# Patient Record
Sex: Male | Born: 1945
Health system: Southern US, Community
[De-identification: ages and names within clinical notes are randomized; demographics above are authoritative.]

## PROBLEM LIST (undated history)

## (undated) DIAGNOSIS — IMO0001 Reserved for inherently not codable concepts without codable children: Secondary | ICD-10-CM

## (undated) DIAGNOSIS — R351 Nocturia: Secondary | ICD-10-CM

## (undated) DIAGNOSIS — I5020 Unspecified systolic (congestive) heart failure: Secondary | ICD-10-CM

## (undated) DIAGNOSIS — E785 Hyperlipidemia, unspecified: Secondary | ICD-10-CM

## (undated) DIAGNOSIS — N410 Acute prostatitis: Secondary | ICD-10-CM

## (undated) DIAGNOSIS — I420 Dilated cardiomyopathy: Secondary | ICD-10-CM

## (undated) DIAGNOSIS — R339 Retention of urine, unspecified: Secondary | ICD-10-CM

## (undated) DIAGNOSIS — I2510 Atherosclerotic heart disease of native coronary artery without angina pectoris: Secondary | ICD-10-CM

## (undated) DIAGNOSIS — I4901 Ventricular fibrillation: Secondary | ICD-10-CM

## (undated) DIAGNOSIS — I10 Essential (primary) hypertension: Secondary | ICD-10-CM

## (undated) DIAGNOSIS — N400 Enlarged prostate without lower urinary tract symptoms: Secondary | ICD-10-CM

## (undated) DIAGNOSIS — N481 Balanitis: Secondary | ICD-10-CM

## (undated) DIAGNOSIS — N471 Phimosis: Secondary | ICD-10-CM

## (undated) HISTORY — DX: Ventricular fibrillation: I49.01

## (undated) HISTORY — DX: Hyperlipidemia, unspecified: E78.5

## (undated) HISTORY — DX: Phimosis: N47.1

## (undated) HISTORY — DX: Retention of urine, unspecified: R33.9

## (undated) HISTORY — DX: Atherosclerotic heart disease of native coronary artery without angina pectoris: I25.10

## (undated) HISTORY — DX: Essential (primary) hypertension: I10

## (undated) HISTORY — PX: OTHER SURGICAL HISTORY: SHX169

## (undated) HISTORY — DX: Nocturia: R35.1

## (undated) HISTORY — DX: Acute prostatitis: N41.0

## (undated) HISTORY — DX: Benign prostatic hyperplasia without lower urinary tract symptoms: N40.0

## (undated) HISTORY — DX: Unspecified systolic (congestive) heart failure: I50.20

## (undated) HISTORY — PX: REPLACEMENT TOTAL KNEE: SUR1224

## (undated) HISTORY — DX: Reserved for inherently not codable concepts without codable children: IMO0001

## (undated) HISTORY — DX: Balanitis: N48.1

## (undated) HISTORY — DX: Dilated cardiomyopathy: I42.0

---

## 2004-06-15 ENCOUNTER — Other Ambulatory Visit: Payer: Self-pay

## 2007-06-15 ENCOUNTER — Ambulatory Visit: Payer: Self-pay | Admitting: General Surgery

## 2009-04-15 ENCOUNTER — Inpatient Hospital Stay: Payer: Self-pay | Admitting: Internal Medicine

## 2009-10-17 ENCOUNTER — Ambulatory Visit: Payer: Self-pay | Admitting: Cardiology

## 2009-10-24 ENCOUNTER — Ambulatory Visit: Payer: Self-pay | Admitting: Cardiology

## 2014-03-14 DIAGNOSIS — I10 Essential (primary) hypertension: Secondary | ICD-10-CM | POA: Insufficient documentation

## 2014-03-14 DIAGNOSIS — I5020 Unspecified systolic (congestive) heart failure: Secondary | ICD-10-CM | POA: Insufficient documentation

## 2014-03-14 DIAGNOSIS — E785 Hyperlipidemia, unspecified: Secondary | ICD-10-CM | POA: Insufficient documentation

## 2014-03-14 DIAGNOSIS — Z95810 Presence of automatic (implantable) cardiac defibrillator: Secondary | ICD-10-CM | POA: Insufficient documentation

## 2014-03-21 ENCOUNTER — Emergency Department: Payer: Self-pay | Admitting: Emergency Medicine

## 2014-03-21 LAB — CBC
HCT: 40.9 % (ref 40.0–52.0)
HGB: 13.8 g/dL (ref 13.0–18.0)
MCH: 31.1 pg (ref 26.0–34.0)
MCHC: 33.9 g/dL (ref 32.0–36.0)
MCV: 92 fL (ref 80–100)
Platelet: 268 10*3/uL (ref 150–440)
RBC: 4.45 10*6/uL (ref 4.40–5.90)
RDW: 12.8 % (ref 11.5–14.5)
WBC: 6.6 10*3/uL (ref 3.8–10.6)

## 2014-03-21 LAB — COMPREHENSIVE METABOLIC PANEL
AST: 27 U/L (ref 15–37)
Albumin: 3.9 g/dL (ref 3.4–5.0)
Alkaline Phosphatase: 107 U/L
Anion Gap: 6 — ABNORMAL LOW (ref 7–16)
BUN: 22 mg/dL — AB (ref 7–18)
Bilirubin,Total: 0.6 mg/dL (ref 0.2–1.0)
CALCIUM: 8.4 mg/dL — AB (ref 8.5–10.1)
CHLORIDE: 108 mmol/L — AB (ref 98–107)
CO2: 26 mmol/L (ref 21–32)
Creatinine: 1.17 mg/dL (ref 0.60–1.30)
EGFR (African American): >60
EGFR (Non-African Amer.): >60
GLUCOSE: 137 mg/dL — AB (ref 65–99)
Osmolality: 285 (ref 275–301)
Potassium: 3.8 mmol/L (ref 3.5–5.1)
SGPT (ALT): 32 U/L (ref 12–78)
Sodium: 140 mmol/L (ref 136–145)
TOTAL PROTEIN: 7 g/dL (ref 6.4–8.2)

## 2014-03-21 LAB — PROTIME-INR
INR: 0.9
Prothrombin Time: 12.4 secs (ref 11.5–14.7)

## 2014-03-21 LAB — APTT: Activated PTT: 37.8 secs — ABNORMAL HIGH (ref 23.6–35.9)

## 2014-03-21 LAB — CK TOTAL AND CKMB (NOT AT ARMC)
CK, Total: 105 U/L
CK-MB: 1.5 ng/mL (ref 0.5–3.6)

## 2014-03-21 LAB — TROPONIN I: Troponin-I: <0.02 ng/mL

## 2014-04-23 ENCOUNTER — Inpatient Hospital Stay: Payer: Self-pay | Admitting: Internal Medicine

## 2014-04-23 LAB — CBC WITH DIFFERENTIAL/PLATELET
Basophil #: 0.1 10*3/uL (ref 0.0–0.1)
Basophil %: 0.9 %
Eosinophil #: 0.1 10*3/uL (ref 0.0–0.7)
Eosinophil %: 2 %
HCT: 40.4 % (ref 40.0–52.0)
HGB: 13.9 g/dL (ref 13.0–18.0)
Lymphocyte #: 1.3 10*3/uL (ref 1.0–3.6)
Lymphocyte %: 19.6 %
MCH: 31.8 pg (ref 26.0–34.0)
MCHC: 34.5 g/dL (ref 32.0–36.0)
MCV: 92 fL (ref 80–100)
Monocyte #: 0.6 x10 3/mm (ref 0.2–1.0)
Monocyte %: 8.9 %
NEUTROS PCT: 68.6 %
Neutrophil #: 4.7 10*3/uL (ref 1.4–6.5)
PLATELETS: 264 10*3/uL (ref 150–440)
RBC: 4.38 10*6/uL — ABNORMAL LOW (ref 4.40–5.90)
RDW: 13 % (ref 11.5–14.5)
WBC: 6.8 10*3/uL (ref 3.8–10.6)

## 2014-04-23 LAB — BASIC METABOLIC PANEL
Anion Gap: 6 — ABNORMAL LOW (ref 7–16)
BUN: 21 mg/dL — ABNORMAL HIGH (ref 7–18)
CALCIUM: 8.7 mg/dL (ref 8.5–10.1)
CHLORIDE: 108 mmol/L — AB (ref 98–107)
CO2: 24 mmol/L (ref 21–32)
Creatinine: 0.98 mg/dL (ref 0.60–1.30)
EGFR (African American): >60
EGFR (Non-African Amer.): >60
Glucose: 133 mg/dL — ABNORMAL HIGH (ref 65–99)
Osmolality: 281 (ref 275–301)
Potassium: 3.5 mmol/L (ref 3.5–5.1)
SODIUM: 138 mmol/L (ref 136–145)

## 2014-04-23 LAB — TROPONIN I
Troponin-I: <0.02 ng/mL
Troponin-I: <0.02 ng/mL

## 2014-04-24 LAB — LIPID PANEL
Cholesterol: 105 mg/dL (ref 0–200)
HDL Cholesterol: 36 mg/dL — ABNORMAL LOW (ref 40–60)
Ldl Cholesterol, Calc: 57 mg/dL (ref 0–100)
Triglycerides: 58 mg/dL (ref 0–200)
VLDL Cholesterol, Calc: 12 mg/dL (ref 5–40)

## 2014-04-24 LAB — MAGNESIUM: Magnesium: 2.6 mg/dL — ABNORMAL HIGH

## 2014-04-24 LAB — HEMOGLOBIN A1C: HEMOGLOBIN A1C: 6.3 % (ref 4.2–6.3)

## 2014-04-24 LAB — BASIC METABOLIC PANEL
Anion Gap: 5 — ABNORMAL LOW (ref 7–16)
BUN: 19 mg/dL — AB (ref 7–18)
CHLORIDE: 110 mmol/L — AB (ref 98–107)
CREATININE: 0.95 mg/dL (ref 0.60–1.30)
Calcium, Total: 8.8 mg/dL (ref 8.5–10.1)
Co2: 28 mmol/L (ref 21–32)
EGFR (African American): >60
Glucose: 88 mg/dL (ref 65–99)
Osmolality: 287 (ref 275–301)
Potassium: 4.4 mmol/L (ref 3.5–5.1)
SODIUM: 143 mmol/L (ref 136–145)

## 2014-04-24 LAB — CBC WITH DIFFERENTIAL/PLATELET
BASOS ABS: 0 10*3/uL (ref 0.0–0.1)
Basophil %: 0.6 %
Eosinophil #: 0.1 10*3/uL (ref 0.0–0.7)
Eosinophil %: 1.6 %
HCT: 41.4 % (ref 40.0–52.0)
HGB: 14 g/dL (ref 13.0–18.0)
Lymphocyte #: 1.3 10*3/uL (ref 1.0–3.6)
Lymphocyte %: 19.4 %
MCH: 31.2 pg (ref 26.0–34.0)
MCHC: 33.8 g/dL (ref 32.0–36.0)
MCV: 92 fL (ref 80–100)
Monocyte #: 0.6 x10 3/mm (ref 0.2–1.0)
Monocyte %: 8.9 %
Neutrophil #: 4.6 10*3/uL (ref 1.4–6.5)
Neutrophil %: 69.5 %
Platelet: 246 10*3/uL (ref 150–440)
RBC: 4.48 10*6/uL (ref 4.40–5.90)
RDW: 12.9 % (ref 11.5–14.5)
WBC: 6.6 10*3/uL (ref 3.8–10.6)

## 2014-04-24 LAB — TROPONIN I

## 2014-04-25 LAB — CBC
HCT: 43.4 % (ref 40.0–52.0)
HGB: 14.6 g/dL (ref 13.0–18.0)
MCH: 31.4 pg (ref 26.0–34.0)
MCHC: 33.6 g/dL (ref 32.0–36.0)
MCV: 94 fL (ref 80–100)
Platelet: 269 10*3/uL (ref 150–440)
RBC: 4.64 10*6/uL (ref 4.40–5.90)
RDW: 12.8 % (ref 11.5–14.5)
WBC: 8.1 10*3/uL (ref 3.8–10.6)

## 2014-04-25 LAB — BASIC METABOLIC PANEL
ANION GAP: 4 — AB (ref 7–16)
BUN: 14 mg/dL (ref 7–18)
CO2: 27 mmol/L (ref 21–32)
Calcium, Total: 8.8 mg/dL (ref 8.5–10.1)
Chloride: 111 mmol/L — ABNORMAL HIGH (ref 98–107)
Creatinine: 0.88 mg/dL (ref 0.60–1.30)
EGFR (African American): >60
EGFR (Non-African Amer.): >60
Glucose: 114 mg/dL — ABNORMAL HIGH (ref 65–99)
OSMOLALITY: 284 (ref 275–301)
Potassium: 4.8 mmol/L (ref 3.5–5.1)
Sodium: 142 mmol/L (ref 136–145)

## 2014-04-26 LAB — BASIC METABOLIC PANEL
ANION GAP: 3 — AB (ref 7–16)
BUN: 11 mg/dL (ref 7–18)
CHLORIDE: 109 mmol/L — AB (ref 98–107)
CREATININE: 0.98 mg/dL (ref 0.60–1.30)
Calcium, Total: 8.8 mg/dL (ref 8.5–10.1)
Co2: 27 mmol/L (ref 21–32)
EGFR (African American): >60
EGFR (Non-African Amer.): >60
GLUCOSE: 94 mg/dL (ref 65–99)
OSMOLALITY: 277 (ref 275–301)
POTASSIUM: 4.2 mmol/L (ref 3.5–5.1)
SODIUM: 139 mmol/L (ref 136–145)

## 2014-04-26 LAB — CK TOTAL AND CKMB (NOT AT ARMC)
CK, Total: 47 U/L
CK-MB: 0.8 ng/mL (ref 0.5–3.6)

## 2014-04-29 DIAGNOSIS — I2510 Atherosclerotic heart disease of native coronary artery without angina pectoris: Secondary | ICD-10-CM | POA: Insufficient documentation

## 2014-04-29 DIAGNOSIS — I4901 Ventricular fibrillation: Secondary | ICD-10-CM | POA: Insufficient documentation

## 2014-04-29 DIAGNOSIS — I420 Dilated cardiomyopathy: Secondary | ICD-10-CM | POA: Insufficient documentation

## 2014-10-02 DIAGNOSIS — N138 Other obstructive and reflux uropathy: Secondary | ICD-10-CM | POA: Insufficient documentation

## 2014-10-02 DIAGNOSIS — N401 Enlarged prostate with lower urinary tract symptoms: Secondary | ICD-10-CM

## 2014-12-23 DIAGNOSIS — R339 Retention of urine, unspecified: Secondary | ICD-10-CM | POA: Diagnosis not present

## 2014-12-23 DIAGNOSIS — N471 Phimosis: Secondary | ICD-10-CM | POA: Diagnosis not present

## 2014-12-23 DIAGNOSIS — N400 Enlarged prostate without lower urinary tract symptoms: Secondary | ICD-10-CM | POA: Diagnosis not present

## 2014-12-23 DIAGNOSIS — R351 Nocturia: Secondary | ICD-10-CM | POA: Diagnosis not present

## 2014-12-23 DIAGNOSIS — R350 Frequency of micturition: Secondary | ICD-10-CM | POA: Diagnosis not present

## 2014-12-31 DIAGNOSIS — I471 Supraventricular tachycardia: Secondary | ICD-10-CM | POA: Diagnosis not present

## 2015-03-05 DIAGNOSIS — J0100 Acute maxillary sinusitis, unspecified: Secondary | ICD-10-CM | POA: Diagnosis not present

## 2015-03-13 ENCOUNTER — Emergency Department: Payer: Self-pay | Admitting: Emergency Medicine

## 2015-03-13 DIAGNOSIS — Z87891 Personal history of nicotine dependence: Secondary | ICD-10-CM | POA: Diagnosis not present

## 2015-03-13 DIAGNOSIS — R531 Weakness: Secondary | ICD-10-CM | POA: Diagnosis not present

## 2015-03-13 DIAGNOSIS — R61 Generalized hyperhidrosis: Secondary | ICD-10-CM | POA: Diagnosis not present

## 2015-03-13 DIAGNOSIS — R42 Dizziness and giddiness: Secondary | ICD-10-CM | POA: Diagnosis not present

## 2015-03-13 DIAGNOSIS — I10 Essential (primary) hypertension: Secondary | ICD-10-CM | POA: Diagnosis not present

## 2015-03-13 DIAGNOSIS — R55 Syncope and collapse: Secondary | ICD-10-CM | POA: Diagnosis not present

## 2015-03-13 LAB — BASIC METABOLIC PANEL
Anion Gap: 7 (ref 7–16)
BUN: 22 mg/dL — AB
Calcium, Total: 8.6 mg/dL — ABNORMAL LOW
Chloride: 102 mmol/L
Co2: 27 mmol/L
Creatinine: 1.16 mg/dL
EGFR (African American): >60
EGFR (Non-African Amer.): >60
Glucose: 105 mg/dL — ABNORMAL HIGH
POTASSIUM: 3.7 mmol/L
Sodium: 136 mmol/L

## 2015-03-13 LAB — CBC
HCT: 37 % — ABNORMAL LOW (ref 40.0–52.0)
HGB: 12.6 g/dL — ABNORMAL LOW (ref 13.0–18.0)
MCH: 31.7 pg (ref 26.0–34.0)
MCHC: 34 g/dL (ref 32.0–36.0)
MCV: 93 fL (ref 80–100)
Platelet: 340 10*3/uL (ref 150–440)
RBC: 3.97 10*6/uL — ABNORMAL LOW (ref 4.40–5.90)
RDW: 13.2 % (ref 11.5–14.5)
WBC: 10.2 10*3/uL (ref 3.8–10.6)

## 2015-03-13 LAB — TROPONIN I
Troponin-I: <0.03 ng/mL
Troponin-I: <0.03 ng/mL

## 2015-03-18 DIAGNOSIS — E782 Mixed hyperlipidemia: Secondary | ICD-10-CM | POA: Diagnosis not present

## 2015-03-18 DIAGNOSIS — I5022 Chronic systolic (congestive) heart failure: Secondary | ICD-10-CM | POA: Diagnosis not present

## 2015-03-18 DIAGNOSIS — I2510 Atherosclerotic heart disease of native coronary artery without angina pectoris: Secondary | ICD-10-CM | POA: Diagnosis not present

## 2015-03-18 DIAGNOSIS — I4901 Ventricular fibrillation: Secondary | ICD-10-CM | POA: Diagnosis not present

## 2015-03-25 DIAGNOSIS — I10 Essential (primary) hypertension: Secondary | ICD-10-CM | POA: Diagnosis not present

## 2015-03-25 DIAGNOSIS — I4901 Ventricular fibrillation: Secondary | ICD-10-CM | POA: Diagnosis not present

## 2015-03-25 DIAGNOSIS — I429 Cardiomyopathy, unspecified: Secondary | ICD-10-CM | POA: Diagnosis not present

## 2015-03-25 DIAGNOSIS — E780 Pure hypercholesterolemia: Secondary | ICD-10-CM | POA: Diagnosis not present

## 2015-03-27 ENCOUNTER — Ambulatory Visit
Admit: 2015-03-27 | Disposition: A | Discharge status: Home / Self Care | Payer: Self-pay | Attending: Cardiology | Admitting: Cardiology

## 2015-03-27 DIAGNOSIS — I472 Ventricular tachycardia: Secondary | ICD-10-CM | POA: Diagnosis not present

## 2015-03-27 DIAGNOSIS — I420 Dilated cardiomyopathy: Secondary | ICD-10-CM | POA: Diagnosis not present

## 2015-03-27 DIAGNOSIS — I2510 Atherosclerotic heart disease of native coronary artery without angina pectoris: Secondary | ICD-10-CM | POA: Diagnosis not present

## 2015-03-27 DIAGNOSIS — I4901 Ventricular fibrillation: Secondary | ICD-10-CM | POA: Diagnosis not present

## 2015-03-27 DIAGNOSIS — I10 Essential (primary) hypertension: Secondary | ICD-10-CM | POA: Diagnosis not present

## 2015-03-27 DIAGNOSIS — N400 Enlarged prostate without lower urinary tract symptoms: Secondary | ICD-10-CM | POA: Diagnosis not present

## 2015-03-27 DIAGNOSIS — E780 Pure hypercholesterolemia: Secondary | ICD-10-CM | POA: Diagnosis not present

## 2015-03-27 DIAGNOSIS — I5023 Acute on chronic systolic (congestive) heart failure: Secondary | ICD-10-CM | POA: Diagnosis not present

## 2015-03-27 DIAGNOSIS — E785 Hyperlipidemia, unspecified: Secondary | ICD-10-CM | POA: Diagnosis not present

## 2015-03-27 DIAGNOSIS — I5022 Chronic systolic (congestive) heart failure: Secondary | ICD-10-CM | POA: Diagnosis not present

## 2015-03-27 DIAGNOSIS — I447 Left bundle-branch block, unspecified: Secondary | ICD-10-CM | POA: Diagnosis not present

## 2015-04-01 DIAGNOSIS — F418 Other specified anxiety disorders: Secondary | ICD-10-CM | POA: Diagnosis not present

## 2015-04-01 DIAGNOSIS — I4901 Ventricular fibrillation: Secondary | ICD-10-CM | POA: Diagnosis not present

## 2015-04-01 DIAGNOSIS — E780 Pure hypercholesterolemia: Secondary | ICD-10-CM | POA: Diagnosis not present

## 2015-04-01 DIAGNOSIS — I429 Cardiomyopathy, unspecified: Secondary | ICD-10-CM | POA: Diagnosis not present

## 2015-04-01 DIAGNOSIS — I471 Supraventricular tachycardia: Secondary | ICD-10-CM | POA: Diagnosis not present

## 2015-04-08 DIAGNOSIS — I429 Cardiomyopathy, unspecified: Secondary | ICD-10-CM | POA: Diagnosis not present

## 2015-04-08 DIAGNOSIS — I10 Essential (primary) hypertension: Secondary | ICD-10-CM | POA: Diagnosis not present

## 2015-04-08 DIAGNOSIS — E780 Pure hypercholesterolemia: Secondary | ICD-10-CM | POA: Diagnosis not present

## 2015-04-08 DIAGNOSIS — N401 Enlarged prostate with lower urinary tract symptoms: Secondary | ICD-10-CM | POA: Diagnosis not present

## 2015-04-09 DIAGNOSIS — E780 Pure hypercholesterolemia: Secondary | ICD-10-CM | POA: Diagnosis not present

## 2015-04-12 NOTE — H&P (Signed)
PATIENT NAME:  [name], [name]#:  [id number] DATE OF BIRTH:  Dec 09, 1946  DATE OF ADMISSION:  04/23/2014  PRIMARY CARE PHYSICIAN:  Dr. Maryjane HurterFeldpausch  CHIEF COMPLAINT: Syncope.   HISTORY OF PRESENT ILLNESS: This is a 69 year old man with history of a defibrillator and congestive heart failure. Since Easter weekend, he had a pass-out  and then they changed medications and adjusted his defibrillator. He has had more fainting spells since then. Last few days, more frequent.  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 Today, he had a fainting spell where he felt it coming and ended up on the floor and hit his head on the concrete and he has a little headache. He does feel weaker than usual. Four weeks ago, he also had another episode where he lost consciousness. In the ER, the defibrillator was interrogated and he had ventricular tachycardia. Hospitalist services were contacted for further evaluation. ER physician, Dr. Ethelda ChickJacubowitz contacted Dr. Lady GaryFath for a consultation from the Emergency Room.   PAST MEDICAL HISTORY: Congestive heart failure, hypertension, hyperlipidemia.   PAST SURGICAL HISTORY: Defibrillator, right knee replacement.   ALLERGIES: No known drug allergies.   MEDICATIONS: Include aspirin 81 mg daily, Coreg 6.25 mg twice a day, Crestor 10 mg once a day at bedtime, enalapril 2.5 mg daily, furosemide 40 mg daily, potassium chloride 20 mEq daily.  He thinks his sotalol is 80 mg twice a day.   SOCIAL HISTORY: No smoking. No alcohol. No drug use. 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   FAMILY HISTORY: Father with COPD and coronary artery disease. Mother died of a CVA. Had CABG at age 67 and also stents after that.   REVIEW OF SYSTEMS:  CONSTITUTIONAL: Positive for headache. Positive for weakness. No fever, chills, or sweats.  EYES: Blurry vision. Wears glasses.  EARS, NOSE, MOUTH, AND THROAT: Ringing in the ears. No sore throat. No difficulty swallowing.  CARDIOVASCULAR: No chest pain. No  palpitations.  RESPIRATORY: Positive for shortness of breath. No cough. No sputum. No hemoptysis.  GASTROINTESTINAL: No nausea. No vomiting. No abdominal pain. No diarrhea. No constipation. No bright red blood per rectum. No melena.  GENITOURINARY: No burning on urination. No hematuria.  MUSCULOSKELETAL: Positive for shoulder pain.  INTEGUMENT: No rashes or eruptions.  NEUROLOGIC: Positive for passing out.  INTEGUMENT: No rashes or eruptions.  PSYCHIATRIC: No anxiety or depression.  ENDOCRINE: No thyroid problems.  HEMATOLOGIC AND LYMPHATIC: No anemia, no easy bruising or bleeding.   PHYSICAL EXAMINATION:  VITAL SIGNS: Temperature 98, pulse 74, respirations 20, blood pressure 154/74, pulse oximetry 95% on room air.  GENERAL: No respiratory distress.  EYES: Conjunctivae and lids normal. Pupils equal, round, and reactive to light. Extraocular muscles intact. No nystagmus.  EARS, NOSE, MOUTH, AND THROAT: Tympanic membranes: No erythema. Nasal mucosa: No erythema. Throat: No erythema. No exudate seen. Lips and gums: No lesions.  NECK: No JVD. No bruits. No lymphadenopathy. No thyromegaly. No thyroid nodules palpated.  RESPIRATORY:  Lungs clear to auscultation. No use of accessory muscles to breathe. No rhonchi, rales, or wheeze heard.  CARDIOVASCULAR: S1, S2 normal. No gallops, rubs, or murmurs heard. Carotid upstroke 2+ bilaterally. No bruits. Dorsalis pedis pulses 2+ bilaterally. No edema of the lower extremity.  ABDOMEN: Soft, nontender. No organomegaly, splenomegaly. Normoactive bowel sounds. No masses felt.  LYMPHATIC: No lymph nodes in the neck.  MUSCULOSKELETAL: No clubbing, edema, or cyanosis.  SKIN: No ulcers or lesions seen.  NEUROLOGIC: Cranial nerves II-XII grossly intact. Deep  tendon reflexes 2+ bilateral lower extremities.  PSYCHIATRIC: The patient is oriented to person, place, and time.  LABORATORY AND RADIOLOGICAL DATA: Glucose 133, BUN 21, creatinine 0.98, sodium 138, potassium  3.5, chloride 108, CO2 of 24, calcium 8.7. White blood cell count 6.8, H and H 13.9 and 40.4, platelet count of 264,000.  EKG: Paced.   ASSESSMENT AND PLAN:  1. Ventricular tachycardia with syncope. We will admit to the CCU.  ER physician spoke with Dr. Lady Gary who will see the patient. I will increase the patient's sotalol to 120 mg twice a day until Dr. Lady Gary sees the patient. Potential change in medications needed. We will get serial cardiac enzymes and continue to monitor closely.  2. Hypertension. Blood pressure currently stable. Continue enalapril and Coreg.  3. Hyperlipidemia. On Crestor. 4. Impaired fasting glucose. We will check a hemoglobin A1c in the a.m.  5. Hypokalemia. We will decrease the dose of Lasix to 20 mg daily since the patient's BUN is a little bit higher. We will give K-Dur 40 mEq stat and 10 mEq daily.   TIME SPENT ON ADMISSION: 50 minutes.   CODE STATUS:  The patient is a full code.    ____________________________ Herschell Dimes. Renae Gloss, MD rjw:dd D: 04/23/2014 19:15:25 ET T: 04/23/2014 19:46:34 ET JOB#: [id number]  cc: Herschell Dimes. Renae Gloss, MD, <Dictator> Marina Goodell, MD Darlin Priestly Lady Gary, MD  Salley Scarlet MD ELECTRONICALLY SIGNED 04/28/2014 14:48

## 2015-04-12 NOTE — Discharge Summary (Signed)
PATIENT NAME:  [name], [name] MR#:  [id number] DATE OF BIRTH:  1946-02-03  DATE OF ADMISSION:  04/23/2014 DATE OF DISCHARGE:  04/26/2014  PRIMARY CARE PHYSICIAN: Dr. York Cerise.  CARDIOLOGIST:  Dr. Lady Gary.   DISCHARGE DIAGNOSES:  1. Syncope secondary to supraventricular tachycardia.  2. History of coronary artery disease with stent in the ramus branch.   Patient had a cardiac catheterization this time and the stent placed in ramus branch. 3. Nonischemic cardiomyopathy.   MEDICATIONS: Aspirin 81 mg p.o. daily, furosemide 40 mg p.o. daily, Enalapril 2.5 mg p.o. b.i.d., Crestor 10 mg p.o. daily, KCl 20 mEq p.o. daily, Plavix 75 mg p.o. daily, Coreg 12.5 mg p.o. b.i.d. The patient will follow up with Dr. Lady Gary on Monday. At that time, patient will possibly start on amiodarone. The patient was given appointment with Dr. Lady Gary on May 11th at 10 a.m.  Patient's Plavix is also new medication. The patient's atenolol has been stopped.   HOSPITAL COURSE: A 69 year old male patient with a history of defibrillator and CHF has been feeling dizzy recently and had a syncopal episode while he was at work on May 5th. Syncope thought to be secondary to V-tach and patient admitted to ICU and his sotalol was increased to 120 mg b.i.d.  He was taking 80 mg b.i.d. at home. The patient was monitored in the ICU for more than 24 hours. Did not have any further episodes of V-tach and patient continued on sotalol and a small dose of Coreg. Dr. Lady Gary did the cardiac catheterization for him.  Patient's cardiac catheterization was done on May 7th.  It revealed 80% lesion in the ramus intermedius.  The patient had a drug-eluting stent and the patient did not have any other blockages but required intervention and he is moved to ICU and monitored for Antivert 24 hours after the stent placement. The patient started on Plavix regarding his V-tach with a defibrillator in place.  The patient discussed this with EP physician. Probably the  patient will be started on amiodarone as an outpatient. For now he will be on Coreg and Plavix but we stopped the sotalol for him.  The patient went home in stable condition. His kidney function and the white count were within normal limits here in the hospital stay. The patient admission was also normal. Troponins have been negative x 3. The patient's echocardiogram showed EF 20% -25% with severely decreased LV ejection fraction.   PHYSICAL EXAMINATION: DISCHARGE VITAL SIGNS: Temperature is 98.7, heart rate 74, blood pressure 136/70, saturations 98% on room air.  GENERAL: He was alert, awake, oriented. Normocephalic, atraumatic.  EYES: Pupils equal, reacting to light.  CARDIOVASCULAR: s1,s2 regular,no   murmurs. PMI not displaced,   LUNGS: Clear to auscultation. No wheeze, no rales.   The patient works in a nursery.  Has told Dr. Lady Gary about that. He suggested that normal activity in terms of her exertion.  No driving. The patient is to seek followup with him on Monday and at that time he is going to decide.  Patient will remain off sotalol for 3 days.  They are going to start amiodarone as an outpatient at 400 mg twice daily.  TIME SPENT ON DISCHARGE PREPARATION: More than 30 minutes.   Of note, Coreg also increased to 12.5 mg b.i.d.  The patient was taking 6.25 mg b.i.d.    ____________________________ Katha Hamming, MD sk:dd D: 04/28/2014 08:24:17 ET T: 04/28/2014 19:55:20 ET JOB#: [id number]  cc: Katha Hamming, MD, <Dictator> Katha Hamming MD  ELECTRONICALLY SIGNED 05/14/2014 22:21

## 2015-04-12 NOTE — Consult Note (Signed)
Brief Consult Note: Diagnosis: syncope secondary to recurrent vf/vf/torsades.   Patient was seen by consultant.   Recommend further assessment or treatment.   Comments: 69 yo male with history of cardiomyopathy with aicd in place. Had syncope secondary to vf/torsades. Is on sotolol at 120 bid with recurrent vt. Will discuss with ep rergarding further medical therapy. WIll need to assess for evidence of ischemic etiology of his symtpoms. WIll proceed with cardiac cath in am.  Electronic Signatures: Dalia HeadingFath, Mckynzi Cammon A (MD)  (Signed 06-May-15 20:37)  Authored: Brief Consult Note   Last Updated: 06-May-15 20:37 by Dalia HeadingFath, Danelia Snodgrass A (MD)

## 2015-04-15 DIAGNOSIS — I509 Heart failure, unspecified: Secondary | ICD-10-CM | POA: Diagnosis not present

## 2015-04-15 DIAGNOSIS — Z4502 Encounter for adjustment and management of automatic implantable cardiac defibrillator: Secondary | ICD-10-CM | POA: Diagnosis not present

## 2015-04-17 DIAGNOSIS — I429 Cardiomyopathy, unspecified: Secondary | ICD-10-CM | POA: Diagnosis not present

## 2015-04-28 DIAGNOSIS — I509 Heart failure, unspecified: Secondary | ICD-10-CM | POA: Diagnosis not present

## 2015-04-28 DIAGNOSIS — I4901 Ventricular fibrillation: Secondary | ICD-10-CM | POA: Diagnosis not present

## 2015-04-28 DIAGNOSIS — Z4502 Encounter for adjustment and management of automatic implantable cardiac defibrillator: Secondary | ICD-10-CM | POA: Diagnosis not present

## 2015-04-28 DIAGNOSIS — Z87891 Personal history of nicotine dependence: Secondary | ICD-10-CM | POA: Diagnosis not present

## 2015-04-28 DIAGNOSIS — I429 Cardiomyopathy, unspecified: Secondary | ICD-10-CM | POA: Diagnosis not present

## 2015-04-28 DIAGNOSIS — I10 Essential (primary) hypertension: Secondary | ICD-10-CM | POA: Diagnosis not present

## 2015-04-28 DIAGNOSIS — E785 Hyperlipidemia, unspecified: Secondary | ICD-10-CM | POA: Diagnosis not present

## 2015-04-28 DIAGNOSIS — I2510 Atherosclerotic heart disease of native coronary artery without angina pectoris: Secondary | ICD-10-CM | POA: Diagnosis not present

## 2015-04-28 DIAGNOSIS — Z7982 Long term (current) use of aspirin: Secondary | ICD-10-CM | POA: Diagnosis not present

## 2015-04-28 DIAGNOSIS — I472 Ventricular tachycardia: Secondary | ICD-10-CM | POA: Diagnosis not present

## 2015-04-28 DIAGNOSIS — I447 Left bundle-branch block, unspecified: Secondary | ICD-10-CM | POA: Diagnosis not present

## 2015-04-29 DIAGNOSIS — I10 Essential (primary) hypertension: Secondary | ICD-10-CM | POA: Diagnosis not present

## 2015-04-29 DIAGNOSIS — Z87891 Personal history of nicotine dependence: Secondary | ICD-10-CM | POA: Diagnosis not present

## 2015-04-29 DIAGNOSIS — I4901 Ventricular fibrillation: Secondary | ICD-10-CM | POA: Diagnosis not present

## 2015-04-29 DIAGNOSIS — Z4502 Encounter for adjustment and management of automatic implantable cardiac defibrillator: Secondary | ICD-10-CM | POA: Diagnosis not present

## 2015-04-29 DIAGNOSIS — Z95810 Presence of automatic (implantable) cardiac defibrillator: Secondary | ICD-10-CM | POA: Diagnosis not present

## 2015-04-29 DIAGNOSIS — I2510 Atherosclerotic heart disease of native coronary artery without angina pectoris: Secondary | ICD-10-CM | POA: Diagnosis not present

## 2015-04-29 DIAGNOSIS — I429 Cardiomyopathy, unspecified: Secondary | ICD-10-CM | POA: Diagnosis not present

## 2015-04-29 DIAGNOSIS — J939 Pneumothorax, unspecified: Secondary | ICD-10-CM | POA: Diagnosis not present

## 2015-04-29 DIAGNOSIS — E785 Hyperlipidemia, unspecified: Secondary | ICD-10-CM | POA: Diagnosis not present

## 2015-04-29 DIAGNOSIS — Z7982 Long term (current) use of aspirin: Secondary | ICD-10-CM | POA: Diagnosis not present

## 2015-04-29 DIAGNOSIS — I472 Ventricular tachycardia: Secondary | ICD-10-CM | POA: Diagnosis not present

## 2015-04-29 DIAGNOSIS — I509 Heart failure, unspecified: Secondary | ICD-10-CM | POA: Diagnosis not present

## 2015-05-08 DIAGNOSIS — M7581 Other shoulder lesions, right shoulder: Secondary | ICD-10-CM | POA: Diagnosis not present

## 2015-05-13 DIAGNOSIS — I429 Cardiomyopathy, unspecified: Secondary | ICD-10-CM | POA: Diagnosis not present

## 2015-05-22 DIAGNOSIS — M25519 Pain in unspecified shoulder: Secondary | ICD-10-CM | POA: Insufficient documentation

## 2015-05-22 DIAGNOSIS — M25511 Pain in right shoulder: Secondary | ICD-10-CM | POA: Diagnosis not present

## 2015-06-13 ENCOUNTER — Encounter: Payer: Self-pay | Admitting: *Deleted

## 2015-06-19 DIAGNOSIS — M25511 Pain in right shoulder: Secondary | ICD-10-CM | POA: Diagnosis not present

## 2015-06-24 ENCOUNTER — Other Ambulatory Visit: Payer: Self-pay | Admitting: Unknown Physician Specialty

## 2015-06-24 ENCOUNTER — Ambulatory Visit: Payer: Self-pay | Admitting: Urology

## 2015-06-24 DIAGNOSIS — M25511 Pain in right shoulder: Secondary | ICD-10-CM

## 2015-07-01 ENCOUNTER — Ambulatory Visit
Admission: RE | Admit: 2015-07-01 | Discharge: 2015-07-01 | Disposition: A | Discharge status: Home / Self Care | Payer: Commercial Managed Care - HMO | Source: Ambulatory Visit | Attending: Unknown Physician Specialty | Admitting: Unknown Physician Specialty

## 2015-07-01 DIAGNOSIS — M25511 Pain in right shoulder: Secondary | ICD-10-CM | POA: Diagnosis not present

## 2015-07-01 DIAGNOSIS — X58XXXA Exposure to other specified factors, initial encounter: Secondary | ICD-10-CM | POA: Insufficient documentation

## 2015-07-01 DIAGNOSIS — S4381XA Sprain of other specified parts of right shoulder girdle, initial encounter: Secondary | ICD-10-CM | POA: Diagnosis not present

## 2015-07-01 DIAGNOSIS — S46911A Strain of unspecified muscle, fascia and tendon at shoulder and upper arm level, right arm, initial encounter: Secondary | ICD-10-CM | POA: Insufficient documentation

## 2015-07-01 DIAGNOSIS — M19011 Primary osteoarthritis, right shoulder: Secondary | ICD-10-CM | POA: Diagnosis not present

## 2015-07-01 DIAGNOSIS — I471 Supraventricular tachycardia: Secondary | ICD-10-CM | POA: Diagnosis not present

## 2015-07-01 DIAGNOSIS — M75101 Unspecified rotator cuff tear or rupture of right shoulder, not specified as traumatic: Secondary | ICD-10-CM | POA: Diagnosis not present

## 2015-07-01 MED ORDER — IOHEXOL 180 MG/ML  SOLN: 20.0000 mL | Freq: Once | INTRAMUSCULAR | Status: AC | PRN

## 2015-07-01 NOTE — Procedures (Signed)
The procedure and associated risks (including but not limited to infection, contrast reaction, bleeding or nondiagnostic study) were discussed with the patient. Questions were answered. Patient has been off blood thinner for over 5 days. Written as well as oral witness consent was obtained.  Patient was prepped and draped in a sterile fashion. Under fluoroscopic guidance and aseptic technique, a 22 gauge spinal needle was advanced into the right shoulder joint. 14 cc of mixture of (15 cc Omnipaque 180 and 5 cc saline) was instilled. Patient was transported to CT suite. Postprocedure instructions were reviewed with the patient. No immediate complication.

## 2015-07-09 ENCOUNTER — Encounter: Payer: Self-pay | Admitting: Urology

## 2015-07-09 ENCOUNTER — Ambulatory Visit (INDEPENDENT_AMBULATORY_CARE_PROVIDER_SITE_OTHER): Payer: Commercial Managed Care - HMO | Admitting: Urology

## 2015-07-09 VITALS — BP 125/72 | HR 65 | Resp 18 | Ht 66.0 in | Wt 166.8 lb

## 2015-07-09 DIAGNOSIS — N401 Enlarged prostate with lower urinary tract symptoms: Secondary | ICD-10-CM | POA: Diagnosis not present

## 2015-07-09 DIAGNOSIS — R351 Nocturia: Secondary | ICD-10-CM

## 2015-07-09 DIAGNOSIS — N138 Other obstructive and reflux uropathy: Secondary | ICD-10-CM | POA: Insufficient documentation

## 2015-07-09 DIAGNOSIS — N471 Phimosis: Secondary | ICD-10-CM | POA: Insufficient documentation

## 2015-07-09 LAB — BLADDER SCAN AMB NON-IMAGING

## 2015-07-09 NOTE — Progress Notes (Signed)
07/09/2015 1:14 PM   Willeen NieceJackie L Grinder [birth year] [id number]  Referring provider: No referring provider defined for this encounter.  Chief Complaint  Patient presents with  . Follow-up  . Nocturia    HPI: Mr. Richrd PrimeWheeley is a 69 year old white male with phimosis and BPH with LUTS who presents today for a 6 month follow-up.  Patient has been using Mycolog II ointment in order to stave off further phimosis. He states the phimosis is worsening and it is becoming more and more difficult to clean the head of the penis. He is worried about incurring infections because of this. So far, he has not had any discharge from the glands or dysuria.  He is not sexually active.     Patient is currently on tamsulosin and finasteride. His IPSS score today is 15, which is moderate lower urinary tract symptomatology.  His PVR today is 36 mL.  His most bothersome symptom is nocturia. He states he is getting up about 3 times a night. Patient is currently on Lasix, but he takes that medication first thing in the morning. He does drink tea and sodas with artificial sweeteners in the afternoons and evenings. He denies any dysuria, hematuria or suprapubic pain. He also denies any recent fevers, chills, nausea, vomiting or infections.     IPSS      07/09/15 1100       International Prostate Symptom Score   How often have you had the sensation of not emptying your bladder? Less than half the time     How often have you had to urinate less than every two hours? About half the time     How often have you found you stopped and started again several times when you urinated? Less than 1 in 5 times     How often have you found it difficult to postpone urination? Less than 1 in 5 times     How often have you had a weak urinary stream? More than half the time     How often have you had to strain to start urination? Less than 1 in 5 times     How many times did you typically get up at night to urinate? 3 Times     Total  IPSS Score 15     Quality of Life due to urinary symptoms   If you were to spend the rest of your life with your urinary condition just the way it is now how would you feel about that? Mixed        Score:  1-7 Mild 8-19 Moderate 20-35 Severe   PMH: Past Medical History  Diagnosis Date  . Hyperlipidemia   . Cardiomyopathy, dilated   . Single vessel coronary artery disease   . Acute prostatitis   . HLD (hyperlipidemia)   . HTN (hypertension)   . Systolic CHF   . Paroxysmal ventricular fibrillation   . Phimosis   . Nocturia   . Benign enlargement of prostate   . Incomplete bladder emptying   . Frequency   . Balanitis     Surgical History: Past Surgical History  Procedure Laterality Date  . Replacement total knee    . Defribrillator      Implant    Home Medications:    Medication List       This list is accurate as of: 07/09/15  1:14 PM.  Always use your most recent med list.  amiodarone 200 MG tablet  Commonly known as:  PACERONE  Take 200 mg by mouth daily.     aspirin EC 81 MG tablet  Take 81 mg by mouth daily.     atorvastatin 40 MG tablet  Commonly known as:  LIPITOR  Take 40 mg by mouth daily.     carvedilol 12.5 MG tablet  Commonly known as:  COREG  Take 12.5 mg by mouth 2 (two) times daily with a meal.     clopidogrel 75 MG tablet  Commonly known as:  PLAVIX  Take 75 mg by mouth daily.     enalapril 2.5 MG tablet  Commonly known as:  VASOTEC  Take 2.5 mg by mouth daily.     finasteride 5 MG tablet  Commonly known as:  PROSCAR  Take 5 mg by mouth daily.     fluticasone 50 MCG/ACT nasal spray  Commonly known as:  FLONASE  Place into both nostrils daily.     furosemide 40 MG tablet  Commonly known as:  LASIX  Take 40 mg by mouth daily.     nystatin-triamcinolone ointment  Commonly known as:  MYCOLOG  Apply 1 application topically 2 (two) times daily.     sertraline 100 MG tablet  Commonly known as:  ZOLOFT  Take  25 mg by mouth daily.     spironolactone 12.5 mg Tabs tablet  Commonly known as:  ALDACTONE  Take 12.5 mg by mouth daily.     tamsulosin 0.4 MG Caps capsule  Commonly known as:  FLOMAX  Take 0.4 mg by mouth daily.        Allergies: No Known Allergies  Family History: Family History  Problem Relation Age of Onset  . Kidney disease Mother   . Prostate cancer Neg Hx   . Bladder Cancer Mother     Social History:  reports that he has quit smoking. He does not have any smokeless tobacco history on file. He reports that he does not drink alcohol. His drug history is not on file.  ROS: UROLOGY Frequent Urination?: No Hard to postpone urination?: No Burning/pain with urination?: No Get up at night to urinate?: Yes Leakage of urine?: No Urine stream starts and stops?: No Trouble starting stream?: No Do you have to strain to urinate?: No Blood in urine?: No Urinary tract infection?: No Sexually transmitted disease?: No Injury to kidneys or bladder?: No Painful intercourse?: No Weak stream?: Yes Erection problems?: No Penile pain?: No  Gastrointestinal Nausea?: No Vomiting?: No Indigestion/heartburn?: No Diarrhea?: No Constipation?: No  Constitutional Fever: No Night sweats?: No Weight loss?: No Fatigue?: No  Skin Skin rash/lesions?: No Itching?: No  Eyes Blurred vision?: No Double vision?: No  Ears/Nose/Throat Sore throat?: No Sinus problems?: No  Hematologic/Lymphatic Swollen glands?: No Easy bruising?: No  Cardiovascular Leg swelling?: No Chest pain?: No  Respiratory Cough?: No Shortness of breath?: No  Endocrine Excessive thirst?: No  Musculoskeletal Back pain?: No Joint pain?: No  Neurological Headaches?: No Dizziness?: No  Psychologic Depression?: No Anxiety?: No  Physical Exam: BP 125/72 mmHg  Pulse 65  Resp 18  Ht  (1.676 m)  Wt 166 lb 12.8 oz (75.66 kg)  BMI 26.94 kg/m2  GU: Patient with uncircumcised phallus.  Foreskin is phimosis and cannot be retracted to expose the glans.  Urethral meatus is patent.  No penile discharge. No penile lesions or rashes. Scrotum without lesions, cysts, rashes and/or edema.  Testicles are located scrotally bilaterally. No masses are appreciated  in the testicles. Left and right epididymis are normal. Rectal: Patient with  normal sphincter tone. Perineum without scarring or rashes. No rectal masses are appreciated. Prostate is approximately 50 grams, no nodules are appreciated. Seminal vesicles are normal.   Laboratory Data: Results for orders placed or performed in visit on 07/09/15  BLADDER SCAN AMB NON-IMAGING  Result Value Ref Range   Scan Result 36ml    Lab Results  Component Value Date   WBC 10.2 03/13/2015   HGB 12.6* 03/13/2015   HCT 37.0* 03/13/2015   MCV 93 03/13/2015   PLT 340 03/13/2015    Lab Results  Component Value Date   CREATININE 1.16 03/13/2015    No results found for: PSA  No results found for: TESTOSTERONE  No results found for: HGBA1C  Urinalysis No results found for: COLORURINE, APPEARANCEUR, LABSPEC, PHURINE, GLUCOSEU, HGBUR, BILIRUBINUR, KETONESUR, PROTEINUR, UROBILINOGEN, NITRITE, LEUKOCYTESUR  Pertinent Imaging:   Assessment & Plan:    1. Phimosis:   He will consult with his cardiologist, Dr. Lady Gary about coming off his anticoagulants for a dorsal slit procedure.  He is scheduled for rotator cuff surgery in the next 2 weeks, so the dorsal slit procedure would probably not occur for several weeks.  He will continue the Mycolog II ointment in the interim.  2.  BPH with LUTS:   IPSS score 15/3. PVR 36 mL. Patient is currently on tamsulosin and finasteride. He will continue those medications.  He will return to the office in 6 months time for IPSS score, PVR and PSA.  PSA History:    2.0 ng/mL on 05/20/2014    1.0 ng/mL on 12/02/2014  - PSA - BLADDER SCAN AMB NON-IMAGING  3. Nocturia:   We discussed trying to take the Lasix  in the afternoon and eliminating his consumption of diet sweet tea and diet sodas.  If he really wanted to drink one of these, he will limit that to the morning time.  He will return to our office in 6 months and we will reassess at that time.   Return in about 6 months (around 01/09/2016).  Michiel Cowboy, PA-C  [facility] [address], [address] [place], [place] [address] [phone]

## 2015-07-10 ENCOUNTER — Telehealth: Payer: Self-pay

## 2015-07-10 LAB — PSA: Prostate Specific Ag, Serum: 0.4 ng/mL (ref 0.0–4.0)

## 2015-07-10 NOTE — Telephone Encounter (Signed)
No. According to our records its a 25mo f/u with you.

## 2015-07-10 NOTE — Telephone Encounter (Signed)
-----   Message from Harle Battiest, PA-C sent at 07/10/2015  8:21 AM EDT ----- Patient's PSA is stable.  We will see him in 6 months.  Please have him come in a week before his appointment for his blood work, so that I will have it available at his appointment with me.

## 2015-07-10 NOTE — Telephone Encounter (Signed)
Is the patient's appointment in October for the dorsal slit?

## 2015-07-10 NOTE — Telephone Encounter (Signed)
Spoke with pt wife who stated pt has an appt in October. Does pt need to keep Oct appt or change it to Jan? Please advise.

## 2015-07-10 NOTE — Telephone Encounter (Signed)
That will have to be corrected.  He needs to see me in 6 months. He is scheduled for shoulder surgery at this point and will be a long recovery period he was then going to get approval from Dr. Lady Gary to come off his aspirin and Plavix again to receive a dorsal slit. That was to be in about 3 months.

## 2015-07-15 DIAGNOSIS — M75122 Complete rotator cuff tear or rupture of left shoulder, not specified as traumatic: Secondary | ICD-10-CM | POA: Insufficient documentation

## 2015-07-15 DIAGNOSIS — M75121 Complete rotator cuff tear or rupture of right shoulder, not specified as traumatic: Secondary | ICD-10-CM | POA: Diagnosis not present

## 2015-07-15 NOTE — Telephone Encounter (Signed)
Spoke with pt in reference to f/u appt. Pt stated he will call back at a later time to make 91mo f/u.

## 2015-07-18 DIAGNOSIS — E780 Pure hypercholesterolemia: Secondary | ICD-10-CM | POA: Diagnosis not present

## 2015-07-18 DIAGNOSIS — I429 Cardiomyopathy, unspecified: Secondary | ICD-10-CM | POA: Diagnosis not present

## 2015-07-18 DIAGNOSIS — I4901 Ventricular fibrillation: Secondary | ICD-10-CM | POA: Diagnosis not present

## 2015-07-18 DIAGNOSIS — Z01818 Encounter for other preprocedural examination: Secondary | ICD-10-CM | POA: Diagnosis not present

## 2015-07-22 DIAGNOSIS — I2510 Atherosclerotic heart disease of native coronary artery without angina pectoris: Secondary | ICD-10-CM | POA: Diagnosis not present

## 2015-07-22 DIAGNOSIS — Z01818 Encounter for other preprocedural examination: Secondary | ICD-10-CM | POA: Diagnosis not present

## 2015-07-28 ENCOUNTER — Encounter
Admission: RE | Admit: 2015-07-28 | Discharge: 2015-07-28 | Disposition: A | Discharge status: Home / Self Care | Payer: Commercial Managed Care - HMO | Source: Ambulatory Visit | Attending: Unknown Physician Specialty | Admitting: Unknown Physician Specialty

## 2015-07-28 DIAGNOSIS — Z01812 Encounter for preprocedural laboratory examination: Secondary | ICD-10-CM | POA: Diagnosis not present

## 2015-07-28 LAB — DIFFERENTIAL
Basophils Absolute: 0 10*3/uL (ref 0–0.1)
Basophils Relative: 1 %
EOS ABS: 0.1 10*3/uL (ref 0–0.7)
Eosinophils Relative: 1 %
Lymphocytes Relative: 10 %
Lymphs Abs: 0.6 10*3/uL — ABNORMAL LOW (ref 1.0–3.6)
MONO ABS: 0.6 10*3/uL (ref 0.2–1.0)
MONOS PCT: 10 %
NEUTROS PCT: 78 %
Neutro Abs: 5.3 10*3/uL (ref 1.4–6.5)

## 2015-07-28 LAB — BASIC METABOLIC PANEL
ANION GAP: 10 (ref 5–15)
BUN: 17 mg/dL (ref 6–20)
CALCIUM: 9 mg/dL (ref 8.9–10.3)
CHLORIDE: 99 mmol/L — AB (ref 101–111)
CO2: 25 mmol/L (ref 22–32)
Creatinine, Ser: 1.15 mg/dL (ref 0.61–1.24)
GFR calc Af Amer: >60 mL/min (ref >60)
GFR calc non Af Amer: >60 mL/min (ref >60)
Glucose, Bld: 123 mg/dL — ABNORMAL HIGH (ref 65–99)
POTASSIUM: 4.1 mmol/L (ref 3.5–5.1)
Sodium: 134 mmol/L — ABNORMAL LOW (ref 135–145)

## 2015-07-28 LAB — CBC
HCT: 38.2 % — ABNORMAL LOW (ref 40.0–52.0)
Hemoglobin: 12.7 g/dL — ABNORMAL LOW (ref 13.0–18.0)
MCH: 31.7 pg (ref 26.0–34.0)
MCHC: 33.3 g/dL (ref 32.0–36.0)
MCV: 95.2 fL (ref 80.0–100.0)
PLATELETS: 248 10*3/uL (ref 150–440)
RBC: 4.02 MIL/uL — ABNORMAL LOW (ref 4.40–5.90)
RDW: 14.5 % (ref 11.5–14.5)
WBC: 6.6 10*3/uL (ref 3.8–10.6)

## 2015-07-28 NOTE — Patient Instructions (Signed)
  Your procedure is scheduled on: 08/06/15 Report to Day Surgery. [facility] SECOND FLOOR To find out your arrival time please call [phone] between 1PM - 3PM on8/16/16 Remember: Instructions that are not followed completely may result in serious medical risk, up to and including death, or upon the discretion of your surgeon and anesthesiologist your surgery may need to be rescheduled.    _X___ 1. Do not eat food or drink liquids after midnight. No gum chewing or hard candies.     __X__ 2. No Alcohol for 24 hours before or after surgery.   ____ 3. Bring all medications with you on the day of surgery if instructed.    _X__ 4. Notify your doctor if there is any change in your medical condition     (cold, fever, infections).     Do not wear jewelry, make-up, hairpins, clips or nail polish.  Do not wear lotions, powders, or perfumes. You may wear deodorant.  Do not shave 48 hours prior to surgery. Men may shave face and neck.  Do not bring valuables to the hospital.    [facility] is not responsible for any belongings or valuables.               Contacts, dentures or bridgework may not be worn into surgery.  Leave your suitcase in the car. After surgery it may be brought to your room.  For patients admitted to the hospital, discharge time is determined by your                treatment team.   Patients discharged the day of surgery will not be allowed to drive home.   Please read over the following fact sheets that you were given:   Surgical Site Infection Prevention   __X__ Take these medicines the morning of surgery with A SIP OF WATER:    1.CARVEDILOL  2. ENALAPRIL  3. AMIODARONE  4.  5.  6.  ____ Fleet Enema (as directed)   ___X_ Use CHG Soap as directed  ____ Use inhalers on the day of surgery  ____ Stop metformin 2 days prior to surgery    ____ Take 1/2 of usual insulin dose the night before surgery and none on the morning of surgery.   __X__ Stop  Coumadin/Plavix/aspirin on  STOP ASPIRIN AND PLAVIX 1 WEEK BEFORE SURGERY  ____ Stop Anti-inflammatories on    ____ Stop supplements until after surgery.    ____ Bring C-Pap to the hospital.

## 2015-07-28 NOTE — OR Nursing (Signed)
STRESS BY DR Lady Gary 07/22/15

## 2015-07-29 DIAGNOSIS — Z87891 Personal history of nicotine dependence: Secondary | ICD-10-CM | POA: Diagnosis not present

## 2015-07-29 DIAGNOSIS — I509 Heart failure, unspecified: Secondary | ICD-10-CM | POA: Diagnosis not present

## 2015-07-29 DIAGNOSIS — Z95810 Presence of automatic (implantable) cardiac defibrillator: Secondary | ICD-10-CM | POA: Diagnosis not present

## 2015-07-29 DIAGNOSIS — R9431 Abnormal electrocardiogram [ECG] [EKG]: Secondary | ICD-10-CM | POA: Diagnosis not present

## 2015-08-04 DIAGNOSIS — M75121 Complete rotator cuff tear or rupture of right shoulder, not specified as traumatic: Secondary | ICD-10-CM | POA: Diagnosis present

## 2015-08-04 DIAGNOSIS — E785 Hyperlipidemia, unspecified: Secondary | ICD-10-CM | POA: Diagnosis not present

## 2015-08-04 DIAGNOSIS — S43081A Other subluxation of right shoulder joint, initial encounter: Secondary | ICD-10-CM | POA: Diagnosis present

## 2015-08-04 DIAGNOSIS — Z950 Presence of cardiac pacemaker: Secondary | ICD-10-CM | POA: Diagnosis not present

## 2015-08-04 DIAGNOSIS — I472 Ventricular tachycardia: Secondary | ICD-10-CM | POA: Diagnosis not present

## 2015-08-04 DIAGNOSIS — Z8249 Family history of ischemic heart disease and other diseases of the circulatory system: Secondary | ICD-10-CM | POA: Diagnosis not present

## 2015-08-04 DIAGNOSIS — Z79899 Other long term (current) drug therapy: Secondary | ICD-10-CM | POA: Diagnosis not present

## 2015-08-04 DIAGNOSIS — Z96651 Presence of right artificial knee joint: Secondary | ICD-10-CM | POA: Diagnosis not present

## 2015-08-04 DIAGNOSIS — Z7982 Long term (current) use of aspirin: Secondary | ICD-10-CM | POA: Diagnosis not present

## 2015-08-04 DIAGNOSIS — I10 Essential (primary) hypertension: Secondary | ICD-10-CM | POA: Diagnosis not present

## 2015-08-04 DIAGNOSIS — R351 Nocturia: Secondary | ICD-10-CM | POA: Diagnosis not present

## 2015-08-04 DIAGNOSIS — N400 Enlarged prostate without lower urinary tract symptoms: Secondary | ICD-10-CM | POA: Diagnosis not present

## 2015-08-04 DIAGNOSIS — E876 Hypokalemia: Secondary | ICD-10-CM | POA: Diagnosis not present

## 2015-08-04 DIAGNOSIS — I509 Heart failure, unspecified: Secondary | ICD-10-CM | POA: Diagnosis not present

## 2015-08-04 DIAGNOSIS — I4901 Ventricular fibrillation: Secondary | ICD-10-CM | POA: Diagnosis not present

## 2015-08-04 DIAGNOSIS — I25119 Atherosclerotic heart disease of native coronary artery with unspecified angina pectoris: Secondary | ICD-10-CM | POA: Diagnosis not present

## 2015-08-04 DIAGNOSIS — I429 Cardiomyopathy, unspecified: Secondary | ICD-10-CM | POA: Diagnosis not present

## 2015-08-04 DIAGNOSIS — Z951 Presence of aortocoronary bypass graft: Secondary | ICD-10-CM | POA: Diagnosis not present

## 2015-08-04 DIAGNOSIS — Z833 Family history of diabetes mellitus: Secondary | ICD-10-CM | POA: Diagnosis not present

## 2015-08-04 DIAGNOSIS — Z825 Family history of asthma and other chronic lower respiratory diseases: Secondary | ICD-10-CM | POA: Diagnosis not present

## 2015-08-04 DIAGNOSIS — I2510 Atherosclerotic heart disease of native coronary artery without angina pectoris: Secondary | ICD-10-CM | POA: Diagnosis not present

## 2015-08-04 DIAGNOSIS — I252 Old myocardial infarction: Secondary | ICD-10-CM | POA: Diagnosis not present

## 2015-08-04 DIAGNOSIS — I5020 Unspecified systolic (congestive) heart failure: Secondary | ICD-10-CM | POA: Diagnosis not present

## 2015-08-04 DIAGNOSIS — I447 Left bundle-branch block, unspecified: Secondary | ICD-10-CM | POA: Diagnosis not present

## 2015-08-04 DIAGNOSIS — M7541 Impingement syndrome of right shoulder: Secondary | ICD-10-CM | POA: Diagnosis not present

## 2015-08-06 ENCOUNTER — Encounter
Admission: RE | Disposition: A | Discharge status: Home / Self Care | Payer: Self-pay | Source: Ambulatory Visit | Attending: Unknown Physician Specialty

## 2015-08-06 ENCOUNTER — Ambulatory Visit: Payer: Commercial Managed Care - HMO | Admitting: Anesthesiology

## 2015-08-06 ENCOUNTER — Encounter: Payer: Self-pay | Admitting: *Deleted

## 2015-08-06 ENCOUNTER — Ambulatory Visit
Admission: RE | Admit: 2015-08-06 | Discharge: 2015-08-06 | Disposition: A | Discharge status: Home / Self Care | Payer: Commercial Managed Care - HMO | Source: Ambulatory Visit | Attending: Unknown Physician Specialty | Admitting: Unknown Physician Specialty

## 2015-08-06 DIAGNOSIS — R351 Nocturia: Secondary | ICD-10-CM | POA: Insufficient documentation

## 2015-08-06 DIAGNOSIS — M75122 Complete rotator cuff tear or rupture of left shoulder, not specified as traumatic: Secondary | ICD-10-CM | POA: Diagnosis not present

## 2015-08-06 DIAGNOSIS — E785 Hyperlipidemia, unspecified: Secondary | ICD-10-CM | POA: Diagnosis not present

## 2015-08-06 DIAGNOSIS — I252 Old myocardial infarction: Secondary | ICD-10-CM | POA: Diagnosis not present

## 2015-08-06 DIAGNOSIS — M7541 Impingement syndrome of right shoulder: Secondary | ICD-10-CM | POA: Insufficient documentation

## 2015-08-06 DIAGNOSIS — Z8249 Family history of ischemic heart disease and other diseases of the circulatory system: Secondary | ICD-10-CM | POA: Insufficient documentation

## 2015-08-06 DIAGNOSIS — Z825 Family history of asthma and other chronic lower respiratory diseases: Secondary | ICD-10-CM | POA: Insufficient documentation

## 2015-08-06 DIAGNOSIS — I4901 Ventricular fibrillation: Secondary | ICD-10-CM | POA: Insufficient documentation

## 2015-08-06 DIAGNOSIS — I509 Heart failure, unspecified: Secondary | ICD-10-CM | POA: Diagnosis not present

## 2015-08-06 DIAGNOSIS — Z96651 Presence of right artificial knee joint: Secondary | ICD-10-CM | POA: Insufficient documentation

## 2015-08-06 DIAGNOSIS — I2510 Atherosclerotic heart disease of native coronary artery without angina pectoris: Secondary | ICD-10-CM | POA: Diagnosis not present

## 2015-08-06 DIAGNOSIS — M7522 Bicipital tendinitis, left shoulder: Secondary | ICD-10-CM | POA: Diagnosis not present

## 2015-08-06 DIAGNOSIS — M75121 Complete rotator cuff tear or rupture of right shoulder, not specified as traumatic: Secondary | ICD-10-CM | POA: Diagnosis not present

## 2015-08-06 DIAGNOSIS — I5020 Unspecified systolic (congestive) heart failure: Secondary | ICD-10-CM | POA: Insufficient documentation

## 2015-08-06 DIAGNOSIS — Z833 Family history of diabetes mellitus: Secondary | ICD-10-CM | POA: Insufficient documentation

## 2015-08-06 DIAGNOSIS — I10 Essential (primary) hypertension: Secondary | ICD-10-CM | POA: Diagnosis not present

## 2015-08-06 DIAGNOSIS — Z951 Presence of aortocoronary bypass graft: Secondary | ICD-10-CM | POA: Insufficient documentation

## 2015-08-06 DIAGNOSIS — Z950 Presence of cardiac pacemaker: Secondary | ICD-10-CM | POA: Insufficient documentation

## 2015-08-06 DIAGNOSIS — Z7982 Long term (current) use of aspirin: Secondary | ICD-10-CM | POA: Insufficient documentation

## 2015-08-06 DIAGNOSIS — I429 Cardiomyopathy, unspecified: Secondary | ICD-10-CM | POA: Diagnosis not present

## 2015-08-06 DIAGNOSIS — I25119 Atherosclerotic heart disease of native coronary artery with unspecified angina pectoris: Secondary | ICD-10-CM | POA: Insufficient documentation

## 2015-08-06 DIAGNOSIS — I472 Ventricular tachycardia: Secondary | ICD-10-CM | POA: Insufficient documentation

## 2015-08-06 DIAGNOSIS — M7542 Impingement syndrome of left shoulder: Secondary | ICD-10-CM | POA: Diagnosis not present

## 2015-08-06 DIAGNOSIS — N400 Enlarged prostate without lower urinary tract symptoms: Secondary | ICD-10-CM | POA: Insufficient documentation

## 2015-08-06 DIAGNOSIS — E876 Hypokalemia: Secondary | ICD-10-CM | POA: Diagnosis not present

## 2015-08-06 DIAGNOSIS — I447 Left bundle-branch block, unspecified: Secondary | ICD-10-CM | POA: Insufficient documentation

## 2015-08-06 DIAGNOSIS — Z79899 Other long term (current) drug therapy: Secondary | ICD-10-CM | POA: Insufficient documentation

## 2015-08-06 HISTORY — PX: SHOULDER ARTHROSCOPY WITH OPEN ROTATOR CUFF REPAIR: SHX6092

## 2015-08-06 SURGERY — ARTHROSCOPY, SHOULDER WITH REPAIR, ROTATOR CUFF, OPEN
Anesthesia: Regional | Site: Shoulder | Laterality: Right | Wound class: Clean

## 2015-08-06 MED ORDER — EPINEPHRINE HCL 1 MG/ML IJ SOLN
INTRAMUSCULAR | Status: DC | PRN
  Administered 2015-08-06: 6 mg via INTRAVENOUS

## 2015-08-06 MED ORDER — CEFAZOLIN SODIUM-DEXTROSE 2-3 GM-% IV SOLR
INTRAVENOUS | Status: DC | PRN
  Administered 2015-08-06: 2 g via INTRAVENOUS

## 2015-08-06 MED ORDER — FAMOTIDINE 20 MG PO TABS
20.0000 mg | ORAL_TABLET | Freq: Once | ORAL | Status: AC
  Administered 2015-08-06: 20 mg via ORAL

## 2015-08-06 MED ORDER — LACTATED RINGERS IV SOLN
INTRAVENOUS | Status: DC
  Administered 2015-08-06: 12:00:00 via INTRAVENOUS

## 2015-08-06 MED ORDER — EPHEDRINE SULFATE 50 MG/ML IJ SOLN
INTRAMUSCULAR | Status: DC | PRN
  Administered 2015-08-06: 10 mg via INTRAVENOUS

## 2015-08-06 MED ORDER — ONDANSETRON HCL 4 MG/2ML IJ SOLN
INTRAMUSCULAR | Status: DC | PRN
Start: 2015-08-06 — End: 2015-08-06
  Administered 2015-08-06: 4 mg via INTRAVENOUS

## 2015-08-06 MED ORDER — ROCURONIUM BROMIDE 100 MG/10ML IV SOLN
INTRAVENOUS | Status: DC | PRN
  Administered 2015-08-06: 50 mg via INTRAVENOUS

## 2015-08-06 MED ORDER — MIDAZOLAM HCL 2 MG/2ML IJ SOLN
INTRAMUSCULAR | Status: DC | PRN
  Administered 2015-08-06: 1 mg via INTRAVENOUS

## 2015-08-06 MED ORDER — FENTANYL CITRATE (PF) 100 MCG/2ML IJ SOLN
INTRAMUSCULAR | Status: DC | PRN
  Administered 2015-08-06: 200 ug via INTRAVENOUS
  Administered 2015-08-06: 50 ug via INTRAVENOUS

## 2015-08-06 MED ORDER — IPRATROPIUM-ALBUTEROL 0.5-2.5 (3) MG/3ML IN SOLN
3.0000 mL | Freq: Once | RESPIRATORY_TRACT | Status: AC
  Administered 2015-08-06: 3 mL via RESPIRATORY_TRACT

## 2015-08-06 MED ORDER — ROXICET 5-325 MG PO TABS: 1.0000 | ORAL_TABLET | Freq: Four times a day (QID) | ORAL | Status: DC | PRN

## 2015-08-06 MED ORDER — IPRATROPIUM-ALBUTEROL 0.5-2.5 (3) MG/3ML IN SOLN
RESPIRATORY_TRACT | Status: AC
  Administered 2015-08-06: 3 mL via RESPIRATORY_TRACT
  Filled 2015-08-06: qty 3

## 2015-08-06 MED ORDER — EPINEPHRINE HCL 1 MG/ML IJ SOLN
INTRAMUSCULAR | Status: AC
  Filled 2015-08-06: qty 1

## 2015-08-06 MED ORDER — LIDOCAINE HCL (CARDIAC) 20 MG/ML IV SOLN
INTRAVENOUS | Status: DC | PRN
  Administered 2015-08-06: 70 mg via INTRAVENOUS

## 2015-08-06 MED ORDER — PHENYLEPHRINE HCL 10 MG/ML IJ SOLN
10.0000 mg | INTRAVENOUS | Status: DC | PRN
  Administered 2015-08-06: 20 ug/min via INTRAVENOUS

## 2015-08-06 MED ORDER — LACTATED RINGERS IV SOLN
INTRAVENOUS | Status: DC | PRN
  Administered 2015-08-06: 12150 mL via INTRAVENOUS

## 2015-08-06 MED ORDER — ROPIVACAINE HCL 5 MG/ML IJ SOLN
INTRAMUSCULAR | Status: AC
  Filled 2015-08-06: qty 40

## 2015-08-06 MED ORDER — FAMOTIDINE 20 MG PO TABS
ORAL_TABLET | ORAL | Status: AC
  Administered 2015-08-06: 20 mg via ORAL
  Filled 2015-08-06: qty 1

## 2015-08-06 MED ORDER — FENTANYL CITRATE (PF) 100 MCG/2ML IJ SOLN: 25.0000 ug | INTRAMUSCULAR | Status: DC | PRN

## 2015-08-06 MED ORDER — ONDANSETRON HCL 4 MG/2ML IJ SOLN: 4.0000 mg | Freq: Once | INTRAMUSCULAR | Status: DC | PRN

## 2015-08-06 MED ORDER — PROPOFOL 10 MG/ML IV BOLUS
INTRAVENOUS | Status: DC | PRN
  Administered 2015-08-06: 150 mg via INTRAVENOUS

## 2015-08-06 MED ORDER — PHENYLEPHRINE HCL 10 MG/ML IJ SOLN
INTRAMUSCULAR | Status: DC | PRN
  Administered 2015-08-06: 150 ug via INTRAVENOUS

## 2015-08-06 SURGICAL SUPPLY — 77 items
ADAPTER IRRIG TUBE 2 SPIKE SOL (ADAPTER) ×6 IMPLANT
ANCHOR PEEK 5.5MM (Anchor) ×6 IMPLANT
ANCHOR SUT 5.5 SPEEDSCREW (Screw) ×6 IMPLANT
ARTHROWAND PARAGON T2 (SURGICAL WAND)
BLADE ABRADER 4.5 (BLADE) IMPLANT
BLADE SHAVER 4.5X7 STR FR (MISCELLANEOUS) ×3 IMPLANT
BLADE SURG 15 STRL LF DISP TIS (BLADE) IMPLANT
BLADE SURG 15 STRL SS (BLADE)
BUR ABRADER 5.5 BLK (MISCELLANEOUS) ×1 IMPLANT
BUR BR 5.5 WIDE MOUTH (BURR) ×3 IMPLANT
BUR HOODED 3.0 ABRASION (BURR) IMPLANT
BURR ABRADER 5.5 BLK (MISCELLANEOUS) ×3
CANNULA 8.5X75 THRED (CANNULA) IMPLANT
CANNULA SHAVER 8MMX76MM (CANNULA) IMPLANT
CAP LOCK ULTRA CANNULA (MISCELLANEOUS) ×3 IMPLANT
CHLORAPREP W/TINT 26ML (MISCELLANEOUS) ×6 IMPLANT
CUTTER CANN W/HOLE 4.5 (CUTTER) IMPLANT
DRAPE STERI 35X30 U-POUCH (DRAPES) ×3 IMPLANT
GAUZE SPONGE 4X4 12PLY STRL (GAUZE/BANDAGES/DRESSINGS) ×3 IMPLANT
GLOVE BIO SURGEON STRL SZ7.5 (GLOVE) ×6 IMPLANT
GLOVE BIO SURGEON STRL SZ8 (GLOVE) ×3 IMPLANT
GLOVE INDICATOR 8.0 STRL GRN (GLOVE) ×3 IMPLANT
GOWN STRL REUS W/ TWL LRG LVL3 (GOWN DISPOSABLE) ×1 IMPLANT
GOWN STRL REUS W/TWL LRG LVL3 (GOWN DISPOSABLE) ×2
GOWN STRL REUS W/TWL LRG LVL4 (GOWN DISPOSABLE) ×3 IMPLANT
IV LACTATED RINGER IRRG 3000ML (IV SOLUTION) ×8
IV LR IRRIG 3000ML ARTHROMATIC (IV SOLUTION) ×4 IMPLANT
KIT SHOULDER TRACTION (DRAPES) ×3 IMPLANT
MANIFOLD 4PT FOR NEPTUNE1 (MISCELLANEOUS) ×3 IMPLANT
NDL MAYO CATGUT SZ5 (NEEDLE) ×2
NDL SUT 5 .5 CRC TPR PNT MAYO (NEEDLE) ×1 IMPLANT
NEEDLE 18GX1X1/2 (RX/OR ONLY) (NEEDLE) IMPLANT
NEEDLE MAYO CATGUT SZ 1.5 (NEEDLE) ×3
NEEDLE MAYO CATGUT SZ 2 (NEEDLE) ×1 IMPLANT
NEEDLE SPNL 18GX3.5 QUINCKE PK (NEEDLE) ×3 IMPLANT
PACK ARTHROSCOPY SHOULDER (MISCELLANEOUS) ×3 IMPLANT
PAD GROUND ADULT SPLIT (MISCELLANEOUS) ×3 IMPLANT
PASSER SUT CAPTURE FIRST (SUTURE) ×3 IMPLANT
SET TUBE SUCT SHAVER OUTFL 24K (TUBING) ×3 IMPLANT
SOL PREP PVP 2OZ (MISCELLANEOUS) ×3
SOLUTION PREP PVP 2OZ (MISCELLANEOUS) ×1 IMPLANT
STAPLER SKIN PROX 35W (STAPLE) IMPLANT
SUT ETHIBOND NAB CT1 #1 30IN (SUTURE) IMPLANT
SUT ETHILON 3-0 FS-10 30 BLK (SUTURE)
SUT MAGNUM WIRE 2 (SUTURE) IMPLANT
SUT PDS AB 1 CT1 27 (SUTURE) IMPLANT
SUT PDSII 0 (SUTURE) IMPLANT
SUT PERFECTPASSER WHITE CART (SUTURE) IMPLANT
SUT PROLENE 2 0 CT2 30 (SUTURE) IMPLANT
SUT SMART STITCH CARTRIDGE (SUTURE) IMPLANT
SUT TICRON 2-0 30IN 311381 (SUTURE) IMPLANT
SUT VIC AB 0 CT1 36 (SUTURE) ×3 IMPLANT
SUT VIC AB 0 CT2 27 (SUTURE) IMPLANT
SUT VIC AB 2-0 CT1 27 (SUTURE)
SUT VIC AB 2-0 CT1 TAPERPNT 27 (SUTURE) IMPLANT
SUT VIC AB 2-0 CT2 27 (SUTURE) ×3 IMPLANT
SUT VIC AB 2-0 SH 27 (SUTURE)
SUT VIC AB 2-0 SH 27XBRD (SUTURE) IMPLANT
SUT VIC AB 3-0 SH 27 (SUTURE)
SUT VIC AB 3-0 SH 27X BRD (SUTURE) IMPLANT
SUTURE EHLN 3-0 FS-10 30 BLK (SUTURE) IMPLANT
SUTURE MAGNUM WIRE 2X48 BLK (SUTURE) ×6 IMPLANT
SYRINGE 10CC LL (SYRINGE) IMPLANT
TAPE MICROFOAM 4IN (TAPE) ×3 IMPLANT
TUBING ARTHRO INFLOW-ONLY STRL (TUBING) ×3 IMPLANT
WAND 30 DEG SABER W/CORD (SURGICAL WAND) IMPLANT
WAND ARTHRO PARAGON T2 (SURGICAL WAND) IMPLANT
WAND COVAC 50 IFS (MISCELLANEOUS) IMPLANT
WAND COVATOR 20 (MISCELLANEOUS) IMPLANT
WAND HAND CNTRL MULTIVAC 50 (MISCELLANEOUS) IMPLANT
WAND HAND CNTRL MULTIVAC 90 (MISCELLANEOUS) ×3 IMPLANT
WAND MEGAVAC 90 (MISCELLANEOUS) IMPLANT
WAND TENDON TOPAZ 0 ANGL (MISCELLANEOUS) IMPLANT
WAND TOPAZ EPF  WAS Q (MISCELLANEOUS)
WAND TOPAZ EPF WAS Q (MISCELLANEOUS) IMPLANT
WIRE MAGNUM (SUTURE) ×3 IMPLANT
WRAP SHOULDER HOT/COLD PACK (SOFTGOODS) ×3 IMPLANT

## 2015-08-06 NOTE — Anesthesia Postprocedure Evaluation (Signed)
  Anesthesia Post-op Note  Patient: [name]  Procedure(s) Performed: Procedure(s): SHOULDER ARTHROSCOPY WITH MINI OPEN ROTATOR CUFF REPAIR, SUBACROMIAL DECOPRESSION, RELEASE LONGHEAD BICEP TENDON  (Right)  Anesthesia type:General, Regional  Patient location: PACU  Post pain: Pain level controlled  Post assessment: Post-op Vital signs reviewed, Patient's Cardiovascular Status Stable, Respiratory Function Stable, Patent Airway and No signs of Nausea or vomiting  Post vital signs: Reviewed and stable  Last Vitals:  Filed Vitals:   08/06/15 1833  BP: 115/69  Pulse: 73  Temp:   Resp: 22    Level of consciousness: awake, alert  and patient cooperative  Complications: No apparent anesthesia complications

## 2015-08-06 NOTE — Discharge Instructions (Signed)
Use shoulder immobilizer at all times  Keep dressing dry  Leave dressing in place until first postoperative visit  Return to the clinic about 1 week post surgery  Take 81 mg aspirin or Bufferin tablet twice a day for 2 weeks post surgery  Can sleep with multiple pillows behind the back or in a recliner  Use TENS unit if prescribed  Take pain medication prior to going to sleep the evening after your surgery  Ice pack prn AMBULATORY SURGERY  DISCHARGE INSTRUCTIONS   1) The drugs that you were given will stay in your system until tomorrow so for the next 24 hours you should not:  A) Drive an automobile B) Make any legal decisions C) Drink any alcoholic beverage   2) You may resume regular meals tomorrow.  Today it is better to start with liquids and gradually work up to solid foods.  You may eat anything you prefer, but it is better to start with liquids, then soup and crackers, and gradually work up to solid foods.   3) Please notify your doctor immediately if you have any unusual bleeding, trouble breathing, redness and pain at the surgery site, drainage, fever, or pain not relieved by medication.  4) Additional Instructions:

## 2015-08-06 NOTE — Anesthesia Preprocedure Evaluation (Signed)
Anesthesia Evaluation  Patient identified by MRN, date of birth, ID band Patient awake    Reviewed: Allergy & Precautions, H&P , NPO status , Patient's Chart, lab work & pertinent test results, reviewed documented beta blocker date and time   History of Anesthesia Complications Negative for: history of anesthetic complications  Airway Mallampati: I  TM Distance: >3 FB Neck ROM: full    Dental no notable dental hx. (+) Edentulous Upper, Upper Dentures, Poor Dentition   Pulmonary neg shortness of breath, neg sleep apnea, neg COPDneg recent URI, former smoker,  breath sounds clear to auscultation  Pulmonary exam normal       Cardiovascular Exercise Tolerance: Good hypertension, - angina+ CAD, + Cardiac Stents (Placed in April, 2015) and +CHF - Past MI and - CABG Normal cardiovascular exam- dysrhythmias + pacemaker + Cardiac Defibrillator - Valvular Problems/MurmursRhythm:regular Rate:Normal     Neuro/Psych negative neurological ROS  negative psych ROS   GI/Hepatic negative GI ROS, Neg liver ROS,   Endo/Other  negative endocrine ROS  Renal/GU negative Renal ROS  negative genitourinary   Musculoskeletal   Abdominal   Peds  Hematology negative hematology ROS (+)   Anesthesia Other Findings Past Medical History:   Hyperlipidemia                                               Cardiomyopathy, dilated                                      Single vessel coronary artery disease                        Acute prostatitis                                            HLD (hyperlipidemia)                                         HTN (hypertension)                                           Systolic CHF                                                 Paroxysmal ventricular fibrillation                          Phimosis                                                     Nocturia  Benign  enlargement of prostate                               Incomplete bladder emptying                                  Frequency                                                    Balanitis                                                    Reproductive/Obstetrics negative OB ROS                             Anesthesia Physical Anesthesia Plan  ASA: IV  Anesthesia Plan: General and Regional   Post-op Pain Management: MAC Combined w/ Regional for Post-op pain   Induction:   Airway Management Planned:   Additional Equipment:   Intra-op Plan:   Post-operative Plan:   Informed Consent: I have reviewed the patients History and Physical, chart, labs and discussed the procedure including the risks, benefits and alternatives for the proposed anesthesia with the patient or authorized representative who has indicated his/her understanding and acceptance.   Dental Advisory Given  Plan Discussed with: Anesthesiologist, CRNA and Surgeon  Anesthesia Plan Comments:         Anesthesia Quick Evaluation

## 2015-08-06 NOTE — H&P (Signed)
  H and P reviewed. No changes. Uploaded at later date. 

## 2015-08-06 NOTE — Transfer of Care (Signed)
Immediate Anesthesia Transfer of Care Note  Patient: [name]  Procedure(s) Performed: Procedure(s): SHOULDER ARTHROSCOPY WITH MINI OPEN ROTATOR CUFF REPAIR, SUBACROMIAL DECOPRESSION, RELEASE LONGHEAD BICEP TENDON  (Right)  Patient Location: PACU  Anesthesia Type:General  Level of Consciousness: awake and patient cooperative  Airway & Oxygen Therapy: Patient Spontanous Breathing and Patient connected to face mask oxygen  Post-op Assessment: Report given to RN and Post -op Vital signs reviewed and stable  Post vital signs: Reviewed and stable  Last Vitals:  Filed Vitals:   08/06/15 1706  BP: 123/84  Pulse: 71  Temp: 37.5 C  Resp: 25    Complications: No apparent anesthesia complications

## 2015-08-06 NOTE — Op Note (Signed)
08/06/2015  5:37 PM  Patient:   [name]  Pre-Op Diagnosis:   COMPLETE TEAR OF RIGHT ROTATOR CUFF  Postoperative diagnosis: Rotator cuff tear along with subluxation of the long head biceps tendon plus secondary impingement   Procedure: Arthroscopic release of the long head of biceps tendon followed by arthroscopic subacromial decompression and mini incision rotator cuff repair  Anesthesia: General endotracheal with interscalene block placed preoperatively by the anesthesiologist.  Findings: As above.   Complications: None  Estimated blood loss: negligible  Tourniquet time: None  Drains: None   Brief clinical note:  The patient's symptoms have progressed despite medications, activity modification, etc. The patient's history and examination are consistent with significant rotator cuff tear. These findings were confirmed by MRI scan. The patient presents at this time for definitive management of these shoulder symptoms.  Procedure: The patient was brought into the operating room and placed in the supine position. The patient then underwent general endotracheal intubation and anesthesia before being repositioned in the lateral decubitus position. The right shoulder was prepped and draped in usual fashion for shoulder procedure. The shoulder was supported with the Acufex shoulder suspension device.  12 pounds of traction was utilized. Preoperative antibiotics were administered. A timeout was performed . A posterior portal was created and the glenohumeral joint thoroughly inspected revealing a frayed long head of biceps tendon that was subluxed plus a frayed labrum and a large rotator cuff tear. An anterior portal was created. An ArthroCare wand was inserted and used to obtain hemostasis as well as to perform a limited synovectomy.The biceps tendon was evaluated and then released from its labral attachment using an ArthroCare wand.  The scope was repositioned  through the posterior portal into the subacromial space. A large cuff tear was visualized. A separate lateral portal was created using an outside-in technique. An ArthroCare 90 wand followed by a 4.0 full-radius resector was introduced and used to perform a subtotal bursectomy. The ArthroCare wand was then inserted and used to remove the periosteal tissue off the undersurface of the anterior third of the acromion as well as to recess the coracoacromial ligament from its attachment along the anterior and lateral margins of the acromion.   With the scope in the lateral portal a 5.56mm acromionizing bur was introduced through the posterior portal and used to perform the decompression by removing the undersurface of the anterior third of the acromion. I also used a 5.5 mm round bur to lightly decorticate the greater tuberosity in the intended area of the rotator rotator cuff reattachment. I also used a small punch to make vascular vent holes in the greater tuberosity. The instruments were then removed from the subacromial space.  An approximately 4 cm incision was made over the midlateral aspect of the shoulder.   This incision was carried down through the subcutaneous tissues onto the deltoid. The deltoid was divided in line with its fibers to provide access into the subacromial space. The rotator cuff tear was readily identified. The margins were debrided.   The tear was repaired using horizontal mattress sutures secured to two Spartan anchors that were placed medially on the greater tuberosity. The horizontal mattress suture tails were then crisscrossed over to 2 laterally placed 5.5 speed screws. This gave me a 2 row repair of the torn rotator cuff. I also placed some simple sutures in the anterolateral and posterior lateral edges of the tear. The repaired cuff seemed to be  quite stable.   The wound was copiously irrigated  with sterile saline solution before the deltoid was repaired to bone with #1 ethibond  suture.  Deltoid interval was closed with 0 vicryl. The subcutaneous tissues were closed  using 2-0 Vicryl interrupted sutures before the skin incision was closed using staples. The portal sites  were closed using 3-O nylon sutures. A sterile bulky dressing was applied to the shoulder  and then the right upper extremity was placed into a shoulder immobilizer. The patient was then awakened, extubated, and returned to the recovery room in satisfactory condition after tolerating the procedure well.  Blood loss was negligible.

## 2015-08-06 NOTE — Anesthesia Procedure Notes (Signed)
Procedure Name: Intubation Date/Time: 08/06/2015 2:07 PM Performed by: Shirlee Limerick, Haruo Stepanek Pre-anesthesia Checklist: Patient identified, Emergency Drugs available, Suction available and Patient being monitored Patient Re-evaluated:Patient Re-evaluated prior to inductionOxygen Delivery Method: Circle system utilized Preoxygenation: Pre-oxygenation with 100% oxygen Intubation Type: IV induction Laryngoscope Size: Mac and 4 Grade View: Grade I Tube type: Oral Tube size: 7.0 mm Number of attempts: 1 Placement Confirmation: ETT inserted through vocal cords under direct vision,  positive ETCO2 and breath sounds checked- equal and bilateral Secured at: 22 cm Tube secured with: Tape Dental Injury: Teeth and Oropharynx as per pre-operative assessment

## 2015-08-07 ENCOUNTER — Encounter: Payer: Self-pay | Admitting: Unknown Physician Specialty

## 2015-08-14 DIAGNOSIS — M75121 Complete rotator cuff tear or rupture of right shoulder, not specified as traumatic: Secondary | ICD-10-CM | POA: Diagnosis not present

## 2015-08-14 DIAGNOSIS — Z9889 Other specified postprocedural states: Secondary | ICD-10-CM | POA: Diagnosis not present

## 2015-09-04 DIAGNOSIS — Z9889 Other specified postprocedural states: Secondary | ICD-10-CM | POA: Insufficient documentation

## 2015-09-16 DIAGNOSIS — M25611 Stiffness of right shoulder, not elsewhere classified: Secondary | ICD-10-CM | POA: Diagnosis not present

## 2015-09-16 DIAGNOSIS — Z9889 Other specified postprocedural states: Secondary | ICD-10-CM | POA: Diagnosis not present

## 2015-09-16 DIAGNOSIS — M25511 Pain in right shoulder: Secondary | ICD-10-CM | POA: Diagnosis not present

## 2015-09-16 DIAGNOSIS — M6281 Muscle weakness (generalized): Secondary | ICD-10-CM | POA: Diagnosis not present

## 2015-09-18 DIAGNOSIS — M6281 Muscle weakness (generalized): Secondary | ICD-10-CM | POA: Diagnosis not present

## 2015-09-18 DIAGNOSIS — Z9889 Other specified postprocedural states: Secondary | ICD-10-CM | POA: Diagnosis not present

## 2015-09-18 DIAGNOSIS — M25511 Pain in right shoulder: Secondary | ICD-10-CM | POA: Diagnosis not present

## 2015-09-18 DIAGNOSIS — M25611 Stiffness of right shoulder, not elsewhere classified: Secondary | ICD-10-CM | POA: Diagnosis not present

## 2015-09-22 DIAGNOSIS — Z9889 Other specified postprocedural states: Secondary | ICD-10-CM | POA: Diagnosis not present

## 2015-09-22 DIAGNOSIS — M6281 Muscle weakness (generalized): Secondary | ICD-10-CM | POA: Diagnosis not present

## 2015-09-22 DIAGNOSIS — M25611 Stiffness of right shoulder, not elsewhere classified: Secondary | ICD-10-CM | POA: Diagnosis not present

## 2015-09-22 DIAGNOSIS — M25511 Pain in right shoulder: Secondary | ICD-10-CM | POA: Diagnosis not present

## 2015-09-24 DIAGNOSIS — Z9889 Other specified postprocedural states: Secondary | ICD-10-CM | POA: Diagnosis not present

## 2015-09-24 DIAGNOSIS — M25511 Pain in right shoulder: Secondary | ICD-10-CM | POA: Diagnosis not present

## 2015-09-24 DIAGNOSIS — M25611 Stiffness of right shoulder, not elsewhere classified: Secondary | ICD-10-CM | POA: Diagnosis not present

## 2015-09-24 DIAGNOSIS — M6281 Muscle weakness (generalized): Secondary | ICD-10-CM | POA: Diagnosis not present

## 2015-09-26 DIAGNOSIS — R05 Cough: Secondary | ICD-10-CM | POA: Diagnosis not present

## 2015-09-26 DIAGNOSIS — J00 Acute nasopharyngitis [common cold]: Secondary | ICD-10-CM | POA: Diagnosis not present

## 2015-09-29 DIAGNOSIS — Z9889 Other specified postprocedural states: Secondary | ICD-10-CM | POA: Diagnosis not present

## 2015-09-29 DIAGNOSIS — M25511 Pain in right shoulder: Secondary | ICD-10-CM | POA: Diagnosis not present

## 2015-09-29 DIAGNOSIS — M6281 Muscle weakness (generalized): Secondary | ICD-10-CM | POA: Diagnosis not present

## 2015-09-29 DIAGNOSIS — M25611 Stiffness of right shoulder, not elsewhere classified: Secondary | ICD-10-CM | POA: Diagnosis not present

## 2015-09-30 DIAGNOSIS — I471 Supraventricular tachycardia: Secondary | ICD-10-CM | POA: Diagnosis not present

## 2015-10-01 DIAGNOSIS — Z9889 Other specified postprocedural states: Secondary | ICD-10-CM | POA: Diagnosis not present

## 2015-10-01 DIAGNOSIS — M6281 Muscle weakness (generalized): Secondary | ICD-10-CM | POA: Diagnosis not present

## 2015-10-01 DIAGNOSIS — M25611 Stiffness of right shoulder, not elsewhere classified: Secondary | ICD-10-CM | POA: Diagnosis not present

## 2015-10-01 DIAGNOSIS — M25511 Pain in right shoulder: Secondary | ICD-10-CM | POA: Diagnosis not present

## 2015-10-03 DIAGNOSIS — M25611 Stiffness of right shoulder, not elsewhere classified: Secondary | ICD-10-CM | POA: Diagnosis not present

## 2015-10-03 DIAGNOSIS — M6281 Muscle weakness (generalized): Secondary | ICD-10-CM | POA: Diagnosis not present

## 2015-10-03 DIAGNOSIS — M25511 Pain in right shoulder: Secondary | ICD-10-CM | POA: Diagnosis not present

## 2015-10-03 DIAGNOSIS — Z9889 Other specified postprocedural states: Secondary | ICD-10-CM | POA: Diagnosis not present

## 2015-10-08 DIAGNOSIS — M25611 Stiffness of right shoulder, not elsewhere classified: Secondary | ICD-10-CM | POA: Diagnosis not present

## 2015-10-08 DIAGNOSIS — M6281 Muscle weakness (generalized): Secondary | ICD-10-CM | POA: Diagnosis not present

## 2015-10-08 DIAGNOSIS — M25511 Pain in right shoulder: Secondary | ICD-10-CM | POA: Diagnosis not present

## 2015-10-08 IMAGING — RF DG ARTHROGRAM SHOULDER*R*
1 series · 5 of 5 positions shown · non-contrast
Comparison: None.

CLINICAL DATA: 69-year-old male with right shoulder pain for 3
months. Initial encounter.

EXAM:
ARTHROGRAM OF THE RIGHT SHOULDER
FLUOROSCOPY TIME:  Radiation Exposure Index (as provided by the
fluoroscopic device): 150.54 micro Gy cm2
TECHNIQUE: The procedure and associated risks (including but not limited to
infection, contrast reaction, bleeding or nondiagnostic study) were
discussed with the patient. Questions were answered. Patient has
been off blood thinner for over 5 days. Written as well as oral
witness consent was obtained.

[Series 1: cp_standard · 0.28mm/px · 5 of 5 slices shown]
[im 1/5]
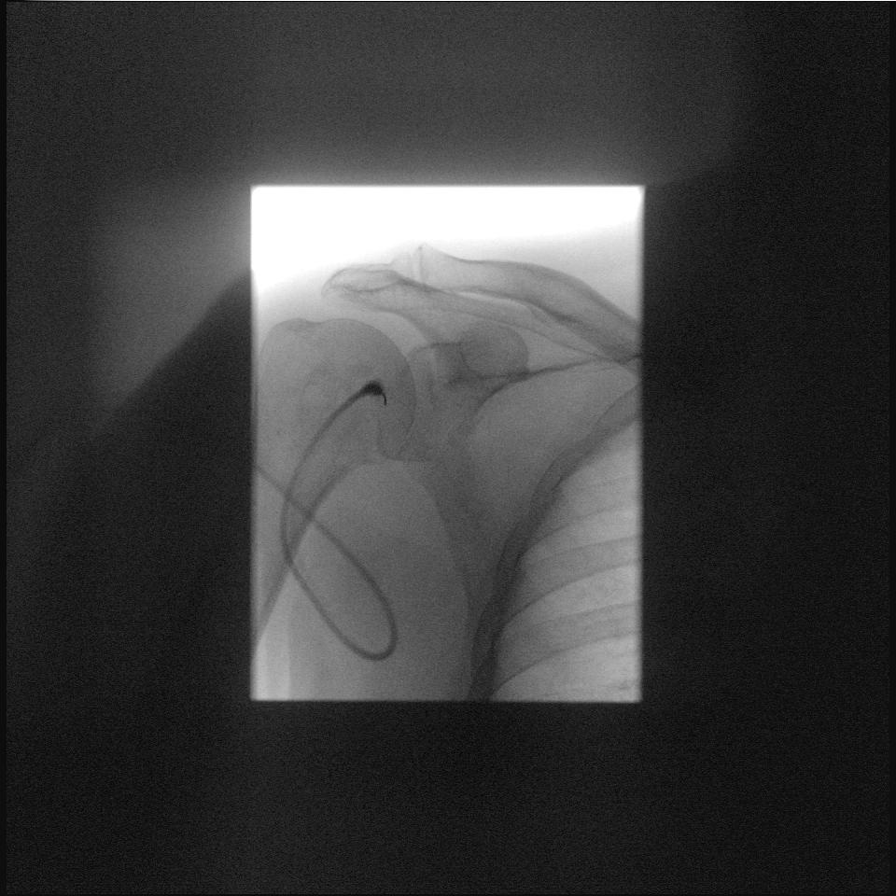
[im 2/5]
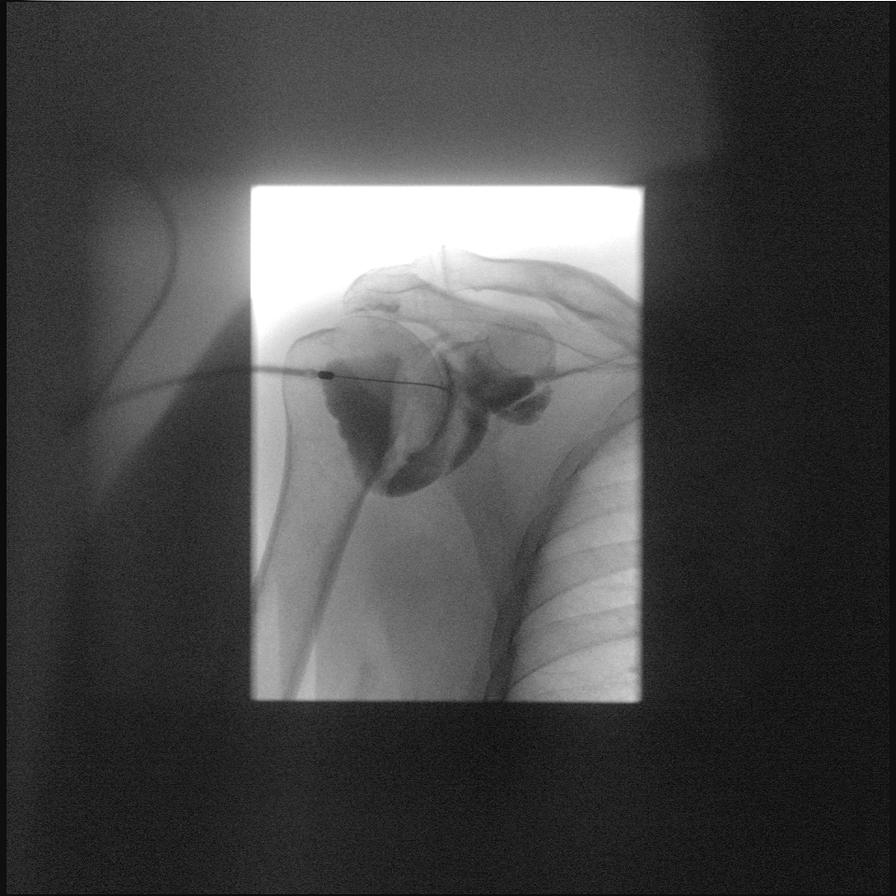
[im 3/5]
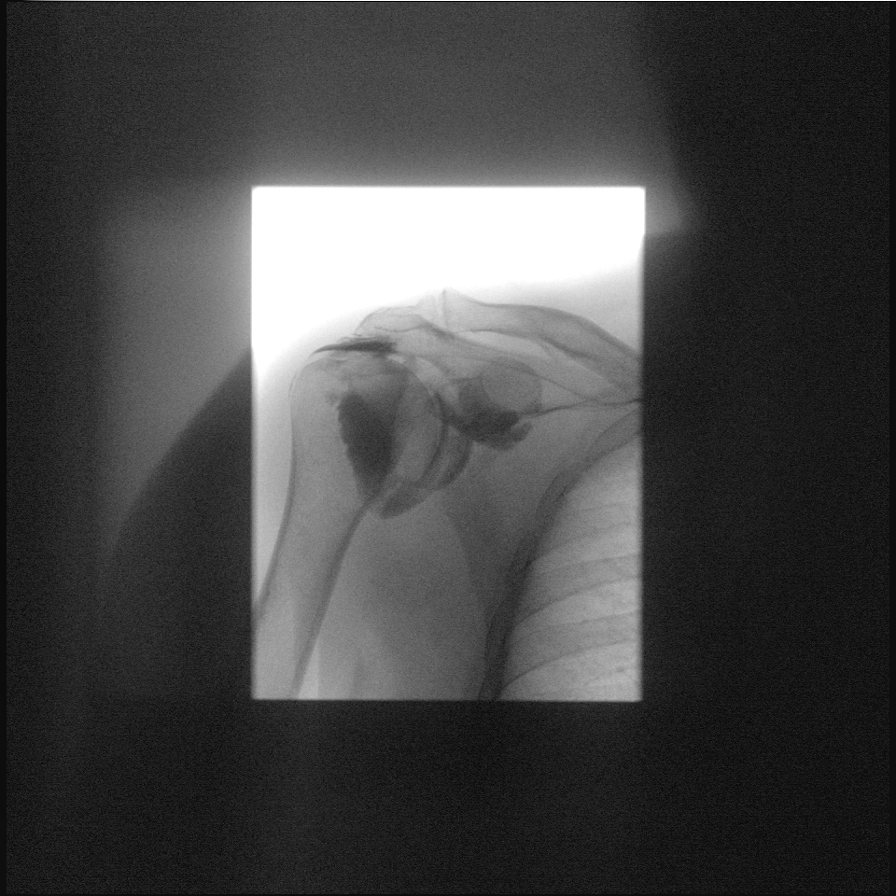
[im 4/5]
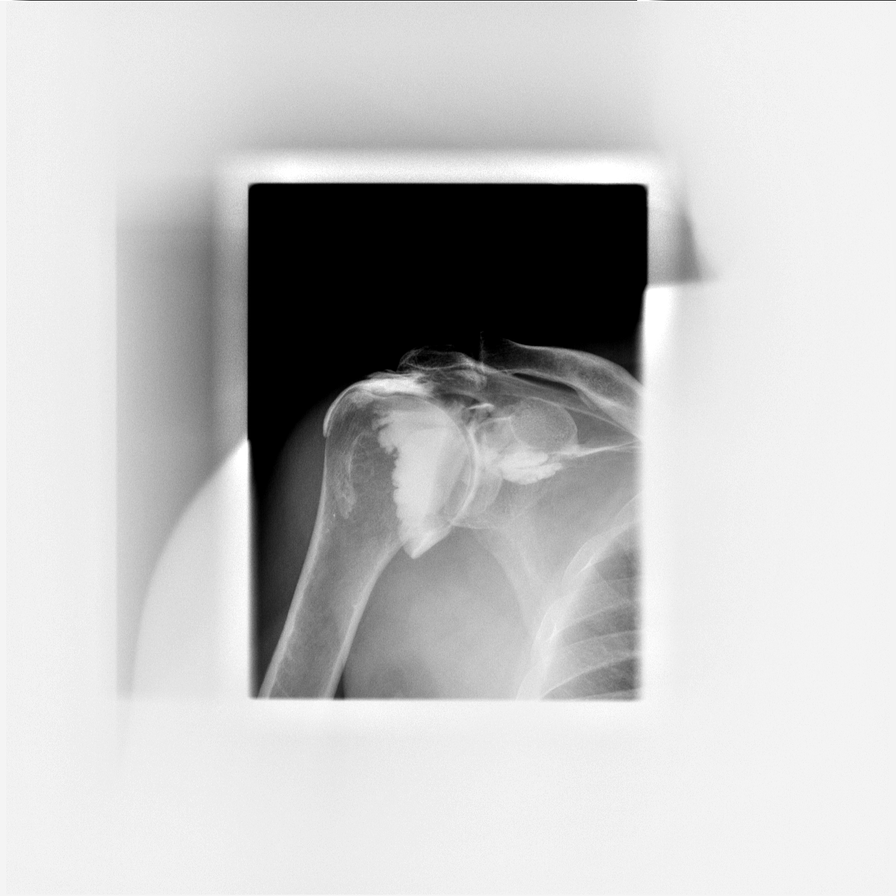
[im 5/5]
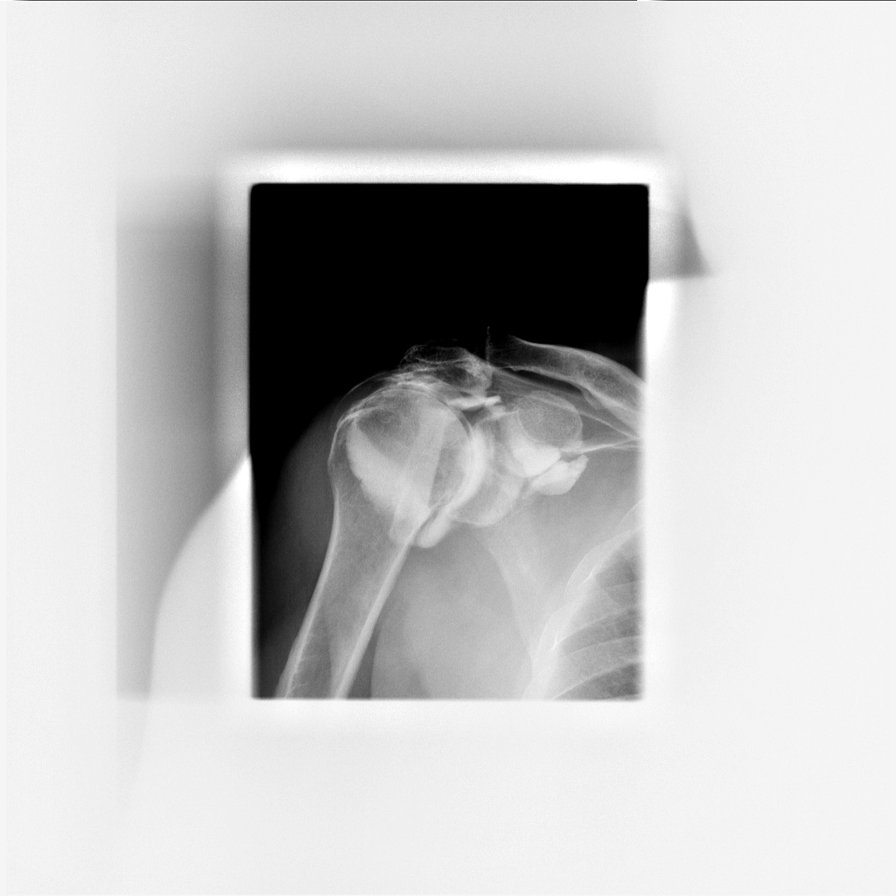

[5 of 5 positions shown; findings below may reference images not displayed]

Patient was prepped and draped in a sterile fashion. Under
fluoroscopic guidance and aseptic technique, a 22 gauge spinal
needle was advanced into the right shoulder joint. 14 cc of mixture
of (15 cc Omnipaque 180 and 5 cc saline) was instilled. Patient was
transported to CT suite. Postprocedure instructions were reviewed
with the patient. No immediate complication.
FINDINGS: Findings consistent with right rotator cuff tear. Please see CT
report.
IMPRESSION: Findings consistent with right rotator cuff tear. Please see CT
report.

## 2015-10-08 IMAGING — CT CT SHOULDER*R* W/CM
3 series · 13 of 20 positions shown, 15 images · non-contrast
Comparison: Injection 07/01/2015.

CLINICAL DATA: RIGHT shoulder pain. Initial encounter. Rotator cuff
tear. Patient unable have MRI because of pacemaker. RIGHT shoulder
pain for 3 months.

EXAM:
CT ARTHROGRAPHY OF THE RIGHT shoulder
TECHNIQUE: Multidetector CT imaging was performed following the standard
protocol after injection of dilute contrast into the joint.

[Series 7: st sagittal · coronal · 0.45mm/px · 3 of 114 slices shown]
[im 23/114  bone]
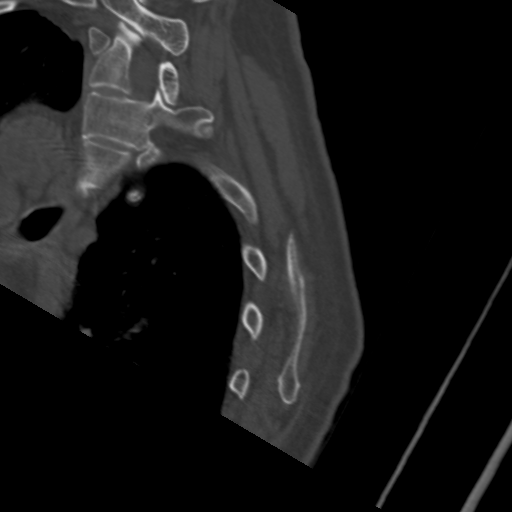
[im 46/114  bone]
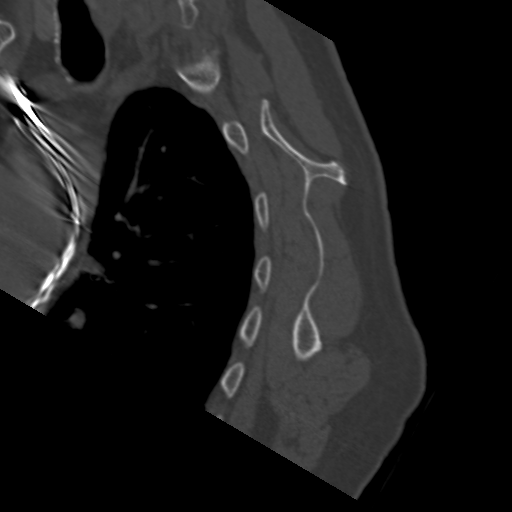
[im 68/114  bone]
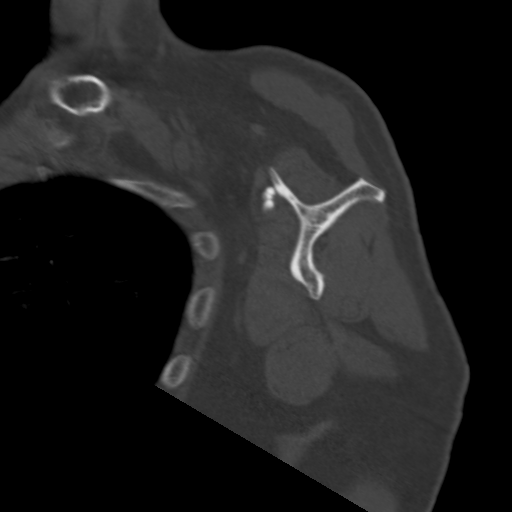

[Series 8: bone axial · axial · 0.41mm/px · z∈[-464,-349]mm · 4 of 108 slices shown]
[im 22/108  bone]
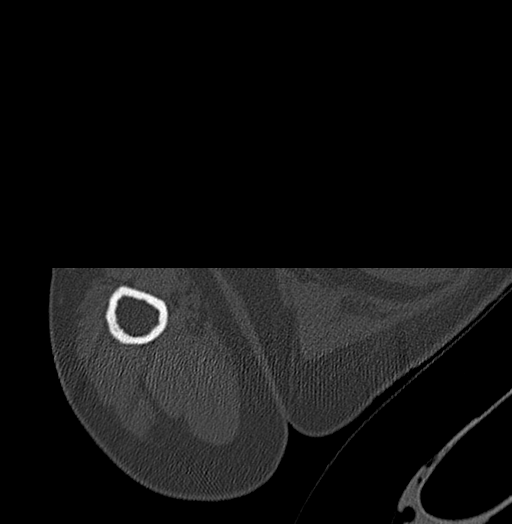
[im 43/108  bone]
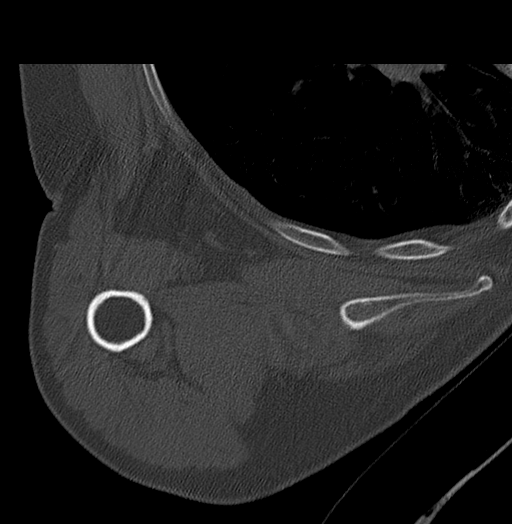
[im 65/108  bone]
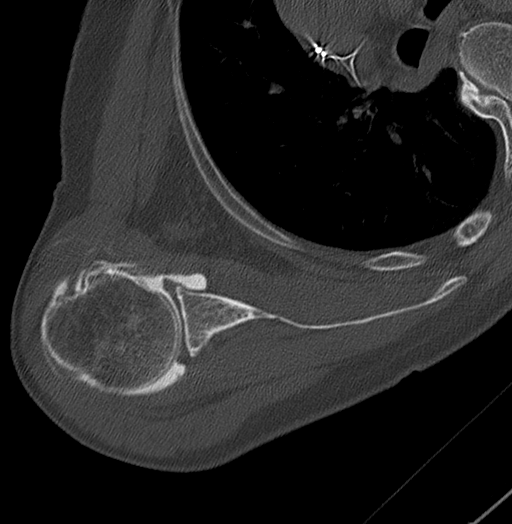
[im 86/108  bone]
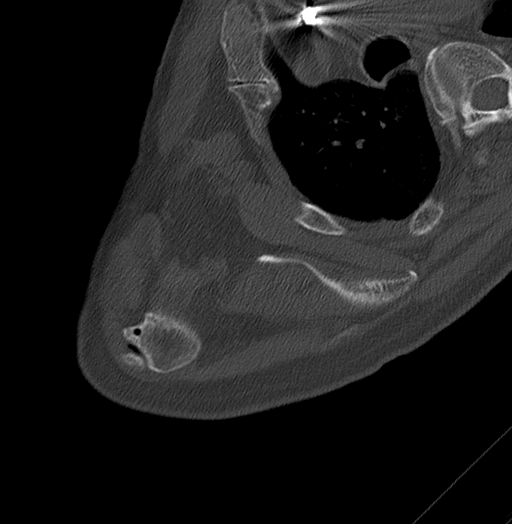

[Series 9: st axial · axial · 0.39mm/px · z∈[-466,-305]mm · 6 of 127 slices shown, 8 images]
[im 19/127  soft-tissue]
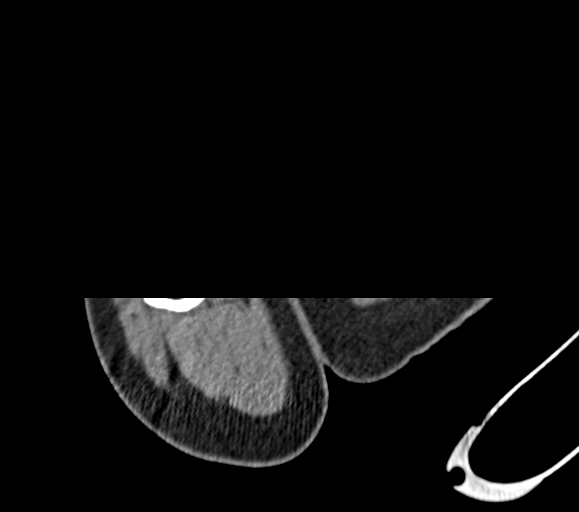
[im 19/127  bone]
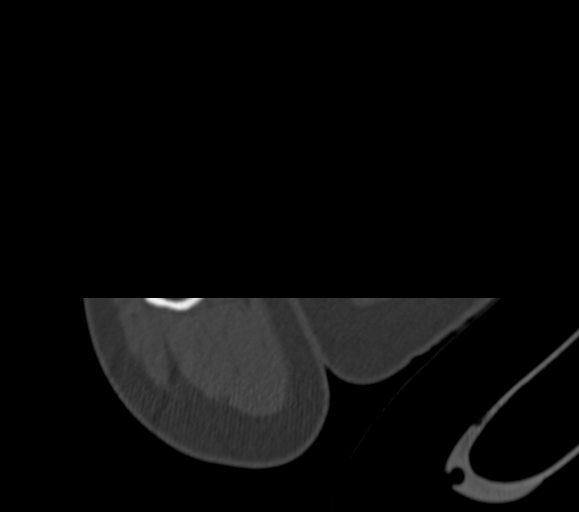
[im 37/127  bone]
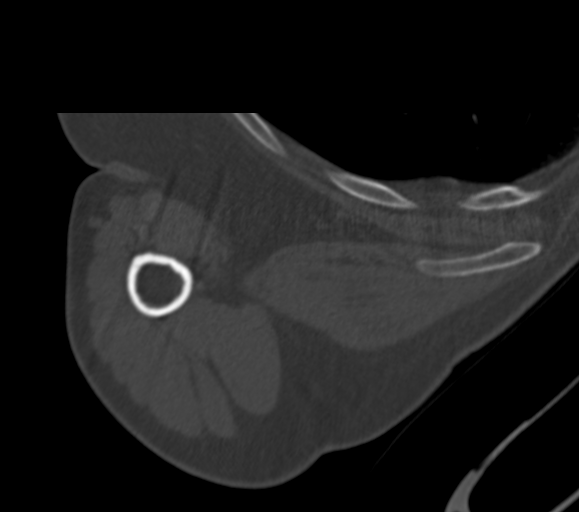
[im 55/127  bone]
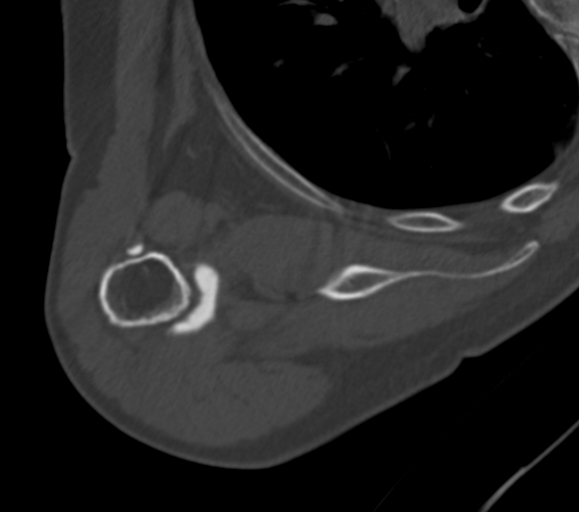
[im 73/127  bone]
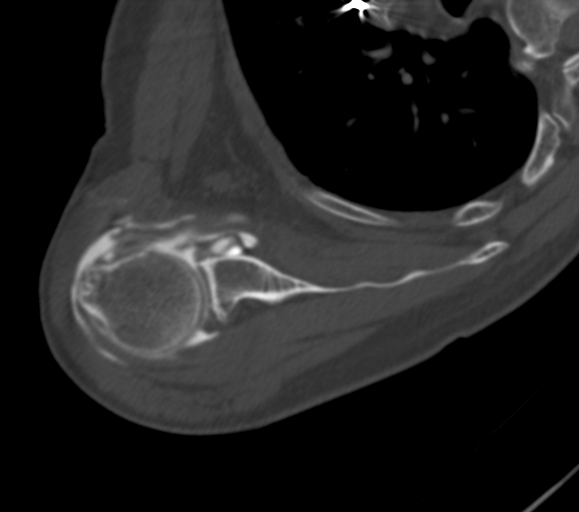
[im 91/127  soft-tissue]
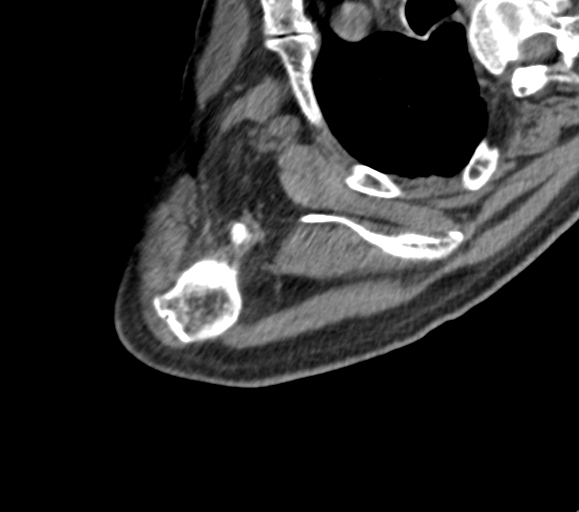
[im 91/127  bone]
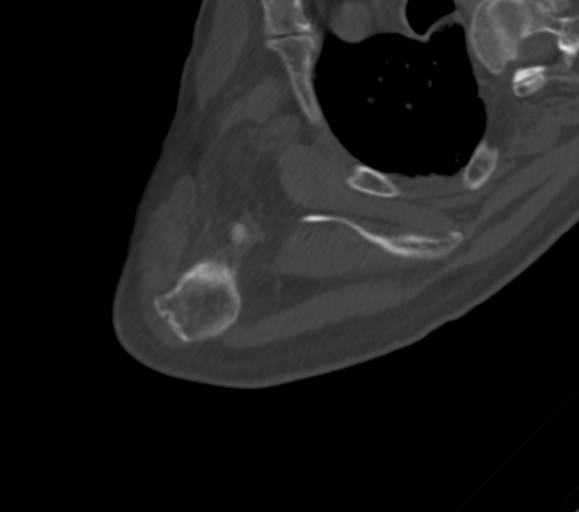
[im 109/127  bone]
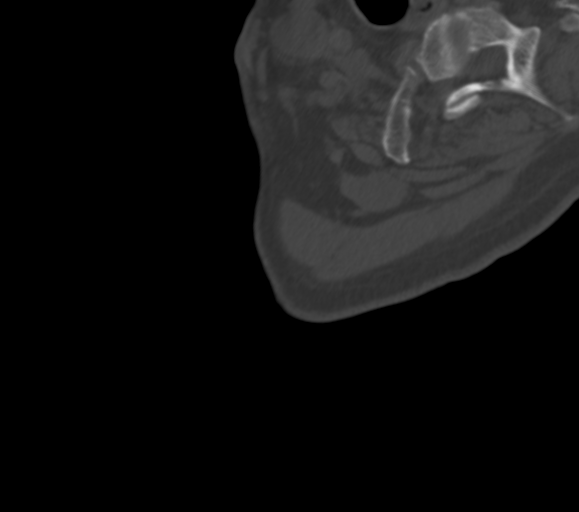

[13 of 20 positions shown; findings below may reference images not displayed]

FINDINGS: There is a complete SUPRASPINATUS tendon tear with retraction
measuring 3 cm. The tear extends into the superior fibers of the
INFRASPINATUS tendon. There is no fatty atrophy of SUPRASPINATUS,
INFRASPINATUS or SUBSCAPULARIS muscles. The teres minor muscle
appears normal. The teres minor tendon appears normal. The
SUBSCAPULARIS tendon shows bursal surface tearing extending nearly
to the articular surface.

Medial subluxation of the biceps long head tendon at the biceps
pulley. Glenohumeral articular cartilage appears preserved without
focal defects, subchondral cysts or a labral tear. No destructive
osseous lesions. The acromion is type 1, flat. Moderate to severe AC
joint osteoarthritis is present. Mild undersurface spurring of the
distal clavicle. Gas-filled cysts in the distal clavicle.

Visible portions of the LEFT chest demonstrate centrilobular and
paraseptal emphysema. Pacemaker leads are visible.
IMPRESSION: 1. Complete SUPRASPINATUS tendon tear extending into the superior
fibers of INFRASPINATUS. No fatty atrophy. Retraction measures about
3 cm.
2. Most of the INFRASPINATUS tendon remains intact.
3. Near full-thickness tear of the distal SUBSCAPULARIS tendon at
the insertion.
4. Medial subluxation of the biceps long head tendon.

## 2015-10-09 ENCOUNTER — Ambulatory Visit: Payer: Commercial Managed Care - HMO | Admitting: Urology

## 2015-10-10 DIAGNOSIS — M6281 Muscle weakness (generalized): Secondary | ICD-10-CM | POA: Diagnosis not present

## 2015-10-10 DIAGNOSIS — Z9889 Other specified postprocedural states: Secondary | ICD-10-CM | POA: Diagnosis not present

## 2015-10-10 DIAGNOSIS — M25611 Stiffness of right shoulder, not elsewhere classified: Secondary | ICD-10-CM | POA: Diagnosis not present

## 2015-10-10 DIAGNOSIS — M25511 Pain in right shoulder: Secondary | ICD-10-CM | POA: Diagnosis not present

## 2015-10-13 DIAGNOSIS — Z9889 Other specified postprocedural states: Secondary | ICD-10-CM | POA: Diagnosis not present

## 2015-10-13 DIAGNOSIS — Z23 Encounter for immunization: Secondary | ICD-10-CM | POA: Diagnosis not present

## 2015-10-13 DIAGNOSIS — M6281 Muscle weakness (generalized): Secondary | ICD-10-CM | POA: Diagnosis not present

## 2015-10-13 DIAGNOSIS — F418 Other specified anxiety disorders: Secondary | ICD-10-CM | POA: Diagnosis not present

## 2015-10-13 DIAGNOSIS — N138 Other obstructive and reflux uropathy: Secondary | ICD-10-CM | POA: Diagnosis not present

## 2015-10-13 DIAGNOSIS — I10 Essential (primary) hypertension: Secondary | ICD-10-CM | POA: Diagnosis not present

## 2015-10-13 DIAGNOSIS — M25611 Stiffness of right shoulder, not elsewhere classified: Secondary | ICD-10-CM | POA: Diagnosis not present

## 2015-10-13 DIAGNOSIS — I429 Cardiomyopathy, unspecified: Secondary | ICD-10-CM | POA: Diagnosis not present

## 2015-10-13 DIAGNOSIS — N401 Enlarged prostate with lower urinary tract symptoms: Secondary | ICD-10-CM | POA: Diagnosis not present

## 2015-10-13 DIAGNOSIS — M25511 Pain in right shoulder: Secondary | ICD-10-CM | POA: Diagnosis not present

## 2015-10-13 DIAGNOSIS — E7800 Pure hypercholesterolemia, unspecified: Secondary | ICD-10-CM | POA: Diagnosis not present

## 2015-10-15 DIAGNOSIS — I429 Cardiomyopathy, unspecified: Secondary | ICD-10-CM | POA: Diagnosis not present

## 2015-10-15 DIAGNOSIS — I5022 Chronic systolic (congestive) heart failure: Secondary | ICD-10-CM | POA: Diagnosis not present

## 2015-10-15 DIAGNOSIS — M25511 Pain in right shoulder: Secondary | ICD-10-CM | POA: Diagnosis not present

## 2015-10-15 DIAGNOSIS — E7800 Pure hypercholesterolemia, unspecified: Secondary | ICD-10-CM | POA: Diagnosis not present

## 2015-10-15 DIAGNOSIS — M25611 Stiffness of right shoulder, not elsewhere classified: Secondary | ICD-10-CM | POA: Diagnosis not present

## 2015-10-15 DIAGNOSIS — M6281 Muscle weakness (generalized): Secondary | ICD-10-CM | POA: Diagnosis not present

## 2015-10-15 DIAGNOSIS — I10 Essential (primary) hypertension: Secondary | ICD-10-CM | POA: Diagnosis not present

## 2015-10-15 DIAGNOSIS — Z95810 Presence of automatic (implantable) cardiac defibrillator: Secondary | ICD-10-CM | POA: Diagnosis not present

## 2015-10-15 DIAGNOSIS — I4901 Ventricular fibrillation: Secondary | ICD-10-CM | POA: Diagnosis not present

## 2015-10-15 DIAGNOSIS — I480 Paroxysmal atrial fibrillation: Secondary | ICD-10-CM | POA: Diagnosis not present

## 2015-10-15 DIAGNOSIS — Z9889 Other specified postprocedural states: Secondary | ICD-10-CM | POA: Diagnosis not present

## 2015-10-17 DIAGNOSIS — Z9889 Other specified postprocedural states: Secondary | ICD-10-CM | POA: Diagnosis not present

## 2015-10-17 DIAGNOSIS — M25511 Pain in right shoulder: Secondary | ICD-10-CM | POA: Diagnosis not present

## 2015-10-17 DIAGNOSIS — M25611 Stiffness of right shoulder, not elsewhere classified: Secondary | ICD-10-CM | POA: Diagnosis not present

## 2015-10-22 DIAGNOSIS — M25611 Stiffness of right shoulder, not elsewhere classified: Secondary | ICD-10-CM | POA: Diagnosis not present

## 2015-10-22 DIAGNOSIS — Z9889 Other specified postprocedural states: Secondary | ICD-10-CM | POA: Diagnosis not present

## 2015-10-22 DIAGNOSIS — M25511 Pain in right shoulder: Secondary | ICD-10-CM | POA: Diagnosis not present

## 2015-10-22 DIAGNOSIS — M6281 Muscle weakness (generalized): Secondary | ICD-10-CM | POA: Diagnosis not present

## 2015-10-23 DIAGNOSIS — M25511 Pain in right shoulder: Secondary | ICD-10-CM | POA: Diagnosis not present

## 2015-10-23 DIAGNOSIS — M25611 Stiffness of right shoulder, not elsewhere classified: Secondary | ICD-10-CM | POA: Diagnosis not present

## 2015-10-23 DIAGNOSIS — Z9889 Other specified postprocedural states: Secondary | ICD-10-CM | POA: Diagnosis not present

## 2015-10-23 DIAGNOSIS — M6281 Muscle weakness (generalized): Secondary | ICD-10-CM | POA: Diagnosis not present

## 2015-10-27 DIAGNOSIS — M25611 Stiffness of right shoulder, not elsewhere classified: Secondary | ICD-10-CM | POA: Diagnosis not present

## 2015-10-27 DIAGNOSIS — Z9889 Other specified postprocedural states: Secondary | ICD-10-CM | POA: Diagnosis not present

## 2015-10-27 DIAGNOSIS — M25511 Pain in right shoulder: Secondary | ICD-10-CM | POA: Diagnosis not present

## 2015-10-27 DIAGNOSIS — M6281 Muscle weakness (generalized): Secondary | ICD-10-CM | POA: Diagnosis not present

## 2015-10-29 DIAGNOSIS — M6281 Muscle weakness (generalized): Secondary | ICD-10-CM | POA: Diagnosis not present

## 2015-10-29 DIAGNOSIS — M25511 Pain in right shoulder: Secondary | ICD-10-CM | POA: Diagnosis not present

## 2015-10-29 DIAGNOSIS — M25611 Stiffness of right shoulder, not elsewhere classified: Secondary | ICD-10-CM | POA: Diagnosis not present

## 2015-10-29 DIAGNOSIS — Z9889 Other specified postprocedural states: Secondary | ICD-10-CM | POA: Diagnosis not present

## 2015-10-31 DIAGNOSIS — M6281 Muscle weakness (generalized): Secondary | ICD-10-CM | POA: Diagnosis not present

## 2015-10-31 DIAGNOSIS — M25611 Stiffness of right shoulder, not elsewhere classified: Secondary | ICD-10-CM | POA: Diagnosis not present

## 2015-10-31 DIAGNOSIS — Z9889 Other specified postprocedural states: Secondary | ICD-10-CM | POA: Diagnosis not present

## 2015-10-31 DIAGNOSIS — M25511 Pain in right shoulder: Secondary | ICD-10-CM | POA: Diagnosis not present

## 2015-11-03 DIAGNOSIS — M25611 Stiffness of right shoulder, not elsewhere classified: Secondary | ICD-10-CM | POA: Diagnosis not present

## 2015-11-03 DIAGNOSIS — Z9889 Other specified postprocedural states: Secondary | ICD-10-CM | POA: Diagnosis not present

## 2015-11-03 DIAGNOSIS — M25511 Pain in right shoulder: Secondary | ICD-10-CM | POA: Diagnosis not present

## 2015-11-03 DIAGNOSIS — M6281 Muscle weakness (generalized): Secondary | ICD-10-CM | POA: Diagnosis not present

## 2015-11-05 DIAGNOSIS — M25611 Stiffness of right shoulder, not elsewhere classified: Secondary | ICD-10-CM | POA: Diagnosis not present

## 2015-11-05 DIAGNOSIS — M6281 Muscle weakness (generalized): Secondary | ICD-10-CM | POA: Diagnosis not present

## 2015-11-05 DIAGNOSIS — Z9889 Other specified postprocedural states: Secondary | ICD-10-CM | POA: Diagnosis not present

## 2015-11-05 DIAGNOSIS — M25511 Pain in right shoulder: Secondary | ICD-10-CM | POA: Diagnosis not present

## 2015-11-07 DIAGNOSIS — Z9889 Other specified postprocedural states: Secondary | ICD-10-CM | POA: Diagnosis not present

## 2015-11-07 DIAGNOSIS — M25611 Stiffness of right shoulder, not elsewhere classified: Secondary | ICD-10-CM | POA: Diagnosis not present

## 2015-11-07 DIAGNOSIS — M6281 Muscle weakness (generalized): Secondary | ICD-10-CM | POA: Diagnosis not present

## 2015-11-07 DIAGNOSIS — M25511 Pain in right shoulder: Secondary | ICD-10-CM | POA: Diagnosis not present

## 2015-11-10 DIAGNOSIS — M25511 Pain in right shoulder: Secondary | ICD-10-CM | POA: Diagnosis not present

## 2015-11-10 DIAGNOSIS — M6281 Muscle weakness (generalized): Secondary | ICD-10-CM | POA: Diagnosis not present

## 2015-11-10 DIAGNOSIS — M25611 Stiffness of right shoulder, not elsewhere classified: Secondary | ICD-10-CM | POA: Diagnosis not present

## 2015-11-10 DIAGNOSIS — Z9889 Other specified postprocedural states: Secondary | ICD-10-CM | POA: Diagnosis not present

## 2015-11-18 DIAGNOSIS — M6281 Muscle weakness (generalized): Secondary | ICD-10-CM | POA: Diagnosis not present

## 2015-11-18 DIAGNOSIS — M25611 Stiffness of right shoulder, not elsewhere classified: Secondary | ICD-10-CM | POA: Diagnosis not present

## 2015-11-18 DIAGNOSIS — Z9889 Other specified postprocedural states: Secondary | ICD-10-CM | POA: Diagnosis not present

## 2015-11-18 DIAGNOSIS — M25511 Pain in right shoulder: Secondary | ICD-10-CM | POA: Diagnosis not present

## 2015-11-20 DIAGNOSIS — M6281 Muscle weakness (generalized): Secondary | ICD-10-CM | POA: Diagnosis not present

## 2015-11-20 DIAGNOSIS — M25511 Pain in right shoulder: Secondary | ICD-10-CM | POA: Diagnosis not present

## 2015-11-20 DIAGNOSIS — M25611 Stiffness of right shoulder, not elsewhere classified: Secondary | ICD-10-CM | POA: Diagnosis not present

## 2015-11-20 DIAGNOSIS — Z9889 Other specified postprocedural states: Secondary | ICD-10-CM | POA: Diagnosis not present

## 2015-11-25 DIAGNOSIS — Z9889 Other specified postprocedural states: Secondary | ICD-10-CM | POA: Diagnosis not present

## 2015-11-25 DIAGNOSIS — M25611 Stiffness of right shoulder, not elsewhere classified: Secondary | ICD-10-CM | POA: Diagnosis not present

## 2015-11-25 DIAGNOSIS — M6281 Muscle weakness (generalized): Secondary | ICD-10-CM | POA: Diagnosis not present

## 2015-11-25 DIAGNOSIS — M25511 Pain in right shoulder: Secondary | ICD-10-CM | POA: Diagnosis not present

## 2015-11-27 DIAGNOSIS — M25511 Pain in right shoulder: Secondary | ICD-10-CM | POA: Diagnosis not present

## 2015-11-27 DIAGNOSIS — M25611 Stiffness of right shoulder, not elsewhere classified: Secondary | ICD-10-CM | POA: Diagnosis not present

## 2015-11-27 DIAGNOSIS — M6281 Muscle weakness (generalized): Secondary | ICD-10-CM | POA: Diagnosis not present

## 2015-11-27 DIAGNOSIS — Z9889 Other specified postprocedural states: Secondary | ICD-10-CM | POA: Diagnosis not present

## 2015-12-02 DIAGNOSIS — Z9889 Other specified postprocedural states: Secondary | ICD-10-CM | POA: Diagnosis not present

## 2015-12-02 DIAGNOSIS — J00 Acute nasopharyngitis [common cold]: Secondary | ICD-10-CM | POA: Diagnosis not present

## 2015-12-04 ENCOUNTER — Encounter: Payer: Self-pay | Admitting: Urology

## 2015-12-04 ENCOUNTER — Ambulatory Visit (INDEPENDENT_AMBULATORY_CARE_PROVIDER_SITE_OTHER): Payer: Commercial Managed Care - HMO | Admitting: Urology

## 2015-12-04 VITALS — BP 111/62 | HR 79 | Ht 64.0 in | Wt 175.5 lb

## 2015-12-04 DIAGNOSIS — N400 Enlarged prostate without lower urinary tract symptoms: Secondary | ICD-10-CM | POA: Diagnosis not present

## 2015-12-04 DIAGNOSIS — N471 Phimosis: Secondary | ICD-10-CM | POA: Diagnosis not present

## 2015-12-04 DIAGNOSIS — N138 Other obstructive and reflux uropathy: Secondary | ICD-10-CM

## 2015-12-04 DIAGNOSIS — N401 Enlarged prostate with lower urinary tract symptoms: Secondary | ICD-10-CM | POA: Diagnosis not present

## 2015-12-04 DIAGNOSIS — R351 Nocturia: Secondary | ICD-10-CM

## 2015-12-04 LAB — URINALYSIS, COMPLETE
Bilirubin, UA: NEGATIVE
Glucose, UA: NEGATIVE
Ketones, UA: NEGATIVE
Leukocytes, UA: NEGATIVE
NITRITE UA: NEGATIVE
PH UA: 5 (ref 5.0–7.5)
Protein, UA: NEGATIVE
RBC, UA: NEGATIVE
Specific Gravity, UA: 1.015 (ref 1.005–1.030)
UUROB: 0.2 mg/dL (ref 0.2–1.0)

## 2015-12-04 LAB — MICROSCOPIC EXAMINATION
BACTERIA UA: NONE SEEN
Epithelial Cells (non renal): NONE SEEN /hpf (ref 0–10)

## 2015-12-04 NOTE — Progress Notes (Signed)
10:18 AM   [name] [birth year] [id number]  Referring provider: Marina Goodell, MD [address] [facility]IMARY [facility] [place], [place] [address]  Chief Complaint  Patient presents with  . Phimosis    getting worse?  Marland Kitchen Benign Prostatic Hypertrophy    6 month recheck    HPI: [name] is a 69 year old white male with phimosis, nocturia and BPH with LUTS who presents today for a 6 month follow-up.  Phimosis Patient has been using Mycolog II ointment in order to stave off further phimosis. He states the phimosis is worsening and it is becoming more and more difficult to clean the head of the penis. He is worried about incurring infections because of this. So far, he has not had any discharge from the glands or dysuria.  He is not sexually active.   He has recovered from his rotator cuff surgery and would like to proceed with the circumcising.  BPH WITH LUTS His IPSS score today is 18, which is moderate lower urinary tract symptomatology. He is mixed with his quality life due to his urinary symptoms.  His previous PVR is 36 mL.   His major complaint today intermittency, weak stream and nocturia x 2.  He has had these symptoms for several years.  He denies any dysuria, hematuria or suprapubic pain.   He currently taking tamsulosin 0.4 mg daily and finasteride 5 mg daily.  His has had urethral dilations in the past.  He also denies any recent fevers, chills, nausea or vomiting.   He does not have a family history of PCa.      IPSS      12/04/15 0900       International Prostate Symptom Score   How often have you had the sensation of not emptying your bladder? About half the time     How often have you had to urinate less than every two hours? About half the time     How often have you found you stopped and started again several times when you urinated? More than half the time     How often have you found it difficult to postpone urination? Less than 1  in 5 times     How often have you had a weak urinary stream? More than half the time     How often have you had to strain to start urination? Less than 1 in 5 times     How many times did you typically get up at night to urinate? 2 Times     Total IPSS Score 18     Quality of Life due to urinary symptoms   If you were to spend the rest of your life with your urinary condition just the way it is now how would you feel about that? Mixed        Score:  1-7 Mild 8-19 Moderate 20-35 Severe  Nocturia Patient is still getting up twice a night since implementing some behavioral and dietary changes.     PMH: Past Medical History  Diagnosis Date  . Hyperlipidemia   . Cardiomyopathy, dilated (HCC)   . Single vessel coronary artery disease   . Acute prostatitis   . HLD (hyperlipidemia)   . HTN (hypertension)   . Systolic CHF (HCC)   . Paroxysmal ventricular fibrillation (HCC)   . Phimosis   . Nocturia   . Benign enlargement of prostate   . Incomplete bladder  emptying   . Frequency   . Balanitis     Surgical History: Past Surgical History  Procedure Laterality Date  . Replacement total knee    . Defribrillator      Implant  . Shoulder arthroscopy with open rotator cuff repair Right 08/06/2015    Procedure: SHOULDER ARTHROSCOPY WITH MINI OPEN ROTATOR CUFF REPAIR, SUBACROMIAL DECOPRESSION, RELEASE LONGHEAD BICEP TENDON ;  Surgeon: Erin SonsHarold Kernodle, MD;  Location: [facility] ORS;  Service: Orthopedics;  Laterality: Right;    Home Medications:    Medication List       This list is accurate as of: 12/04/15 10:18 AM.  Always use your most recent med list.               amiodarone 200 MG tablet  Commonly known as:  PACERONE  Take 200 mg by mouth daily.     aspirin EC 81 MG tablet  Take 81 mg by mouth daily.     atorvastatin 40 MG tablet  Commonly known as:  LIPITOR  Take 40 mg by mouth daily.     carvedilol 12.5 MG tablet  Commonly known as:  COREG  Take 12.5 mg by mouth  2 (two) times daily with a meal.     clopidogrel 75 MG tablet  Commonly known as:  PLAVIX  Take 75 mg by mouth daily.     doxycycline 100 MG capsule  Commonly known as:  MONODOX  Take by mouth. Reported on 12/04/2015     enalapril 2.5 MG tablet  Commonly known as:  VASOTEC  Take 2.5 mg by mouth daily.     finasteride 5 MG tablet  Commonly known as:  PROSCAR  Take 5 mg by mouth daily.     fluticasone 50 MCG/ACT nasal spray  Commonly known as:  FLONASE  Place into both nostrils daily.     furosemide 40 MG tablet  Commonly known as:  LASIX  Take 40 mg by mouth daily.     HYDROcodone-acetaminophen 5-325 MG tablet  Commonly known as:  NORCO/VICODIN  Take 1 tablet by mouth every 6 (six) hours as needed for moderate pain. Reported on 12/04/2015     HYDROcodone-homatropine 5-1.5 MG/5ML syrup  Commonly known as:  HYCODAN  Take by mouth. Reported on 12/04/2015     KLOR-CON M20 20 MEQ tablet  Generic drug:  potassium chloride SA  Reported on 12/04/2015     nystatin-triamcinolone ointment  Commonly known as:  MYCOLOG  Apply 1 application topically 2 (two) times daily.     ROXICET 5-325 MG tablet  Generic drug:  oxyCODONE-acetaminophen  Take 1-2 tablets by mouth every 6 (six) hours as needed for severe pain.     sertraline 100 MG tablet  Commonly known as:  ZOLOFT  Take 25 mg by mouth daily.     spironolactone 12.5 mg Tabs tablet  Commonly known as:  ALDACTONE  Take 12.5 mg by mouth daily.     tamsulosin 0.4 MG Caps capsule  Commonly known as:  FLOMAX  Take 0.4 mg by mouth daily.        Allergies: No Known Allergies  Family History: Family History  Problem Relation Age of Onset  . Kidney disease Mother   . Prostate cancer Neg Hx   . Bladder Cancer Mother     Social History:  reports that he has quit smoking. He does not have any smokeless tobacco history on file. He reports that he does not drink alcohol or use illicit drugs.  ROS: UROLOGY Frequent  Urination?: No Hard to postpone urination?: No Burning/pain with urination?: No Get up at night to urinate?: No Leakage of urine?: No Urine stream starts and stops?: Yes Trouble starting stream?: No Do you have to strain to urinate?: No Blood in urine?: No Urinary tract infection?: No Sexually transmitted disease?: No Injury to kidneys or bladder?: No Painful intercourse?: No Weak stream?: No Erection problems?: No Penile pain?: No  Gastrointestinal Nausea?: No Vomiting?: No Indigestion/heartburn?: No Diarrhea?: No Constipation?: No  Constitutional Fever: No Night sweats?: No Weight loss?: No Fatigue?: No  Skin Skin rash/lesions?: No Itching?: No  Eyes Blurred vision?: No Double vision?: No  Ears/Nose/Throat Sore throat?: No Sinus problems?: No  Hematologic/Lymphatic Swollen glands?: No Easy bruising?: No  Cardiovascular Leg swelling?: No Chest pain?: No  Respiratory Cough?: No Shortness of breath?: No  Endocrine Excessive thirst?: No  Musculoskeletal Back pain?: No Joint pain?: No  Neurological Headaches?: No Dizziness?: No  Psychologic Depression?: No Anxiety?: No  Physical Exam: BP 111/62 mmHg  Pulse 79  Ht  (1.626 m)  Wt 175 lb 8 oz (79.606 kg)  BMI 30.11 kg/m2  GU: Patient with uncircumcised phallus. Foreskin is phimosis and cannot be retracted to expose the glans.  Urethral meatus is patent.  No penile discharge. No penile lesions or rashes. Scrotum without lesions, cysts, rashes and/or edema.  Testicles are located scrotally bilaterally. No masses are appreciated in the testicles. Left and right epididymis are normal. Rectal: Patient with  normal sphincter tone. Perineum without scarring or rashes. No rectal masses are appreciated. Prostate is approximately 50 grams, no nodules are appreciated. Seminal vesicles are normal.   Laboratory Data: Results for orders placed or performed during the hospital encounter of 07/28/15    CBC  Result Value Ref Range   WBC 6.6 3.8 - 10.6 K/uL   RBC 4.02 (L) 4.40 - 5.90 MIL/uL   Hemoglobin 12.7 (L) 13.0 - 18.0 g/dL   HCT 16.1 (L) 09.6 - 04.5 %   MCV 95.2 80.0 - 100.0 fL   MCH 31.7 26.0 - 34.0 pg   MCHC 33.3 32.0 - 36.0 g/dL   RDW 40.9 81.1 - 91.4 %   Platelets 248 150 - 440 K/uL  Differential  Result Value Ref Range   Neutrophils Relative % 78 %   Neutro Abs 5.3 1.4 - 6.5 K/uL   Lymphocytes Relative 10 %   Lymphs Abs 0.6 (L) 1.0 - 3.6 K/uL   Monocytes Relative 10 %   Monocytes Absolute 0.6 0.2 - 1.0 K/uL   Eosinophils Relative 1 %   Eosinophils Absolute 0.1 0 - 0.7 K/uL   Basophils Relative 1 %   Basophils Absolute 0.0 0 - 0.1 K/uL  Basic metabolic panel  Result Value Ref Range   Sodium 134 (L) 135 - 145 mmol/L   Potassium 4.1 3.5 - 5.1 mmol/L   Chloride 99 (L) 101 - 111 mmol/L   CO2 25 22 - 32 mmol/L   Glucose, Bld 123 (H) 65 - 99 mg/dL   BUN 17 6 - 20 mg/dL   Creatinine, Ser 7.82 0.61 - 1.24 mg/dL   Calcium 9.0 8.9 - 95.6 mg/dL   GFR calc non Af Amer >60 >60 mL/min   GFR calc Af Amer >60 >60 mL/min   Anion gap 10 5 - 15   Lab Results  Component Value Date   WBC 6.6 07/28/2015   HGB 12.7* 07/28/2015   HCT 38.2* 07/28/2015   MCV 95.2  07/28/2015   PLT 248 07/28/2015    Lab Results  Component Value Date   CREATININE 1.15 07/28/2015   PSA History:    2.0 ng/mL on 05/20/2014    1.0 ng/mL on 12/02/2014  Lab Results  Component Value Date   PSA 0.4 07/09/2015    Lab Results  Component Value Date   HGBA1C 6.3 04/24/2014    Urinalysis Results for orders placed or performed in visit on 12/04/15  Microscopic Examination  Result Value Ref Range   WBC, UA 0-5 0 -  5 /hpf   RBC, UA 0-2 0 -  2 /hpf   Epithelial Cells (non renal) None seen 0 - 10 /hpf   Bacteria, UA None seen None seen/Few  Urinalysis, Complete  Result Value Ref Range   Specific Gravity, UA 1.015 1.005 - 1.030   pH, UA 5.0 5.0 - 7.5   Color, UA Yellow Yellow   Appearance Ur  Clear Clear   Leukocytes, UA Negative Negative   Protein, UA Negative Negative/Trace   Glucose, UA Negative Negative   Ketones, UA Negative Negative   RBC, UA Negative Negative   Bilirubin, UA Negative Negative   Urobilinogen, Ur 0.2 0.2 - 1.0 mg/dL   Nitrite, UA Negative Negative   Microscopic Examination See below:      Assessment & Plan:    1. Phimosis:   He will consult with his cardiologist, Dr. Lady Gary about coming off his anticoagulants for a circumcision.   He will continue the Mycolog II ointment in the interim.  2.  BPH with LUTS:   IPSS score 18/3.   Patient is currently on tamsulosin and finasteride. He will continue those medications.  He will return to the office in 6 months time for IPSS score, exam and PSA.  - PSA  3. Nocturia:   Patient is having nocturia 2.  He has implemented some behavioral and dietary changes.  We will continue to monitor.  He will RTC in 6 months for a PSA, exam and IPSS score.     Return for patient would like a circumsicion in the office, we will need an okay from his cardiologist to come .  Michiel Cowboy, PA-C  [facility] [address] [place], [place] [address] [phone]

## 2015-12-05 ENCOUNTER — Telehealth: Payer: Self-pay

## 2015-12-05 LAB — PSA: Prostate Specific Ag, Serum: 0.3 ng/mL (ref 0.0–4.0)

## 2015-12-05 NOTE — Telephone Encounter (Signed)
-----   Message from Harle BattiestShannon A McGowan, PA-C sent at 12/05/2015  8:29 AM EST ----- Patient's PSA is stable.

## 2015-12-05 NOTE — Telephone Encounter (Signed)
Spoke with pt wife, Gigi Gineggy, in reference to lab results. Wife voiced understanding.

## 2015-12-07 LAB — CULTURE, URINE COMPREHENSIVE

## 2015-12-08 ENCOUNTER — Telehealth: Payer: Self-pay

## 2015-12-08 DIAGNOSIS — N390 Urinary tract infection, site not specified: Secondary | ICD-10-CM

## 2015-12-08 MED ORDER — SULFAMETHOXAZOLE-TRIMETHOPRIM 800-160 MG PO TABS: 1.0000 | ORAL_TABLET | Freq: Two times a day (BID) | ORAL | Status: AC

## 2015-12-08 NOTE — Telephone Encounter (Signed)
Spoke with pt in reference to +ucx. Pt voiced understanding. Pt will RTC 12/18/15 for u/a and cx.

## 2015-12-08 NOTE — Telephone Encounter (Signed)
-----   Message from Shannon A McGowan, PA-C sent at 12/07/2015  8:21 PM EST ----- Patient has an UTI.  He will need to start Septra DS one tablet twice daily #14, he will then need a repeat UA and urine culture 3 to 5 days after he completes his antibiotics.  He wants to undergo an in-office circumcision in the future. 

## 2015-12-08 NOTE — Telephone Encounter (Signed)
-----   Message from Harle BattiestShannon A McGowan, PA-C sent at 12/07/2015  8:21 PM EST ----- Patient has an UTI.  He will need to start Septra DS one tablet twice daily #14, he will then need a repeat UA and urine culture 3 to 5 days after he completes his antibiotics.  He wants to undergo an in-office circumcision in the future.

## 2015-12-09 ENCOUNTER — Telehealth: Payer: Self-pay | Admitting: Urology

## 2015-12-09 NOTE — Telephone Encounter (Signed)
Spoke with patient and notified him that he was cleared by Dr. Lady GaryFath to stop ASA and Plavix 7 days prior to an office circumcision. Patient was transferred to the front desk to make this apt with Dr. Apolinar JunesBrandon. Clearance form was sent to scan.

## 2015-12-18 ENCOUNTER — Other Ambulatory Visit: Payer: Commercial Managed Care - HMO

## 2015-12-18 DIAGNOSIS — N390 Urinary tract infection, site not specified: Secondary | ICD-10-CM

## 2015-12-18 LAB — URINALYSIS, COMPLETE
Bilirubin, UA: NEGATIVE
Glucose, UA: NEGATIVE
Ketones, UA: NEGATIVE
Leukocytes, UA: NEGATIVE
Nitrite, UA: NEGATIVE
RBC, UA: NEGATIVE
Specific Gravity, UA: 1.02 (ref 1.005–1.030)
Urobilinogen, Ur: 4 mg/dL — ABNORMAL HIGH (ref 0.2–1.0)
pH, UA: 6 (ref 5.0–7.5)

## 2015-12-18 LAB — MICROSCOPIC EXAMINATION: Epithelial Cells (non renal): NONE SEEN /hpf (ref 0–10)

## 2015-12-20 LAB — CULTURE, URINE COMPREHENSIVE

## 2015-12-23 ENCOUNTER — Telehealth: Payer: Self-pay

## 2015-12-23 DIAGNOSIS — N390 Urinary tract infection, site not specified: Secondary | ICD-10-CM

## 2015-12-23 MED ORDER — CIPROFLOXACIN HCL 500 MG PO TABS: 500.0000 mg | ORAL_TABLET | Freq: Two times a day (BID) | ORAL | Status: AC

## 2015-12-23 NOTE — Telephone Encounter (Signed)
Spoke with pt wife in reference to abx and infection. Wife stated she would have pt return call to make cath specimen appt.

## 2015-12-23 NOTE — Telephone Encounter (Signed)
-----   Message from Harle BattiestShannon A McGowan, PA-C sent at 12/23/2015  8:23 AM EST ----- Patient has a +UCx.  They need to start Cipro 500 mg one tablet twice daily for seven days and then we need to check a CATH specimen in 3 to 5 days after they complete their antibiotics.  He has a circ appointment on the 17th.

## 2015-12-30 DIAGNOSIS — I471 Supraventricular tachycardia: Secondary | ICD-10-CM | POA: Diagnosis not present

## 2016-01-01 ENCOUNTER — Ambulatory Visit (INDEPENDENT_AMBULATORY_CARE_PROVIDER_SITE_OTHER): Payer: Commercial Managed Care - HMO

## 2016-01-01 DIAGNOSIS — N390 Urinary tract infection, site not specified: Secondary | ICD-10-CM | POA: Diagnosis not present

## 2016-01-01 LAB — URINALYSIS, COMPLETE
Bilirubin, UA: NEGATIVE
Glucose, UA: NEGATIVE
KETONES UA: NEGATIVE
Leukocytes, UA: NEGATIVE
NITRITE UA: NEGATIVE
Protein, UA: NEGATIVE
RBC, UA: NEGATIVE
Urobilinogen, Ur: 0.2 mg/dL (ref 0.2–1.0)
pH, UA: 5 (ref 5.0–7.5)

## 2016-01-01 LAB — MICROSCOPIC EXAMINATION
Bacteria, UA: NONE SEEN
EPITHELIAL CELLS (NON RENAL): NONE SEEN /HPF (ref 0–10)
RBC MICROSCOPIC, UA: NONE SEEN /HPF (ref 0–?)

## 2016-01-01 NOTE — Progress Notes (Signed)
In and Out Catheterization  Patient is present today for a I & O catheterization due to recent infection. Patient was cleaned and prepped in a sterile fashion with betadine and Lidocaine 2% jelly was instilled into the urethra.  A 14FR cath was inserted no complications were noted , 200ml of urine return was noted, urine was clear and yellow in color. A clean urine sample was collected for u/a and cx. Bladder was drained  And catheter was removed with out difficulty.    Preformed by: Rupert Stackshelsea Watkins, LPN

## 2016-01-03 LAB — CULTURE, URINE COMPREHENSIVE

## 2016-01-05 ENCOUNTER — Telehealth: Payer: Self-pay

## 2016-01-05 NOTE — Telephone Encounter (Signed)
Spoke with pt wife, Gigi Gineggy, and made aware of -ucx. Wife voiced understanding.

## 2016-01-05 NOTE — Telephone Encounter (Signed)
-----   Message from Harle Battiest, PA-C sent at 01/04/2016  7:38 PM EST ----- Urine culture is negative.  Proceed with circumcision.

## 2016-01-06 ENCOUNTER — Ambulatory Visit (INDEPENDENT_AMBULATORY_CARE_PROVIDER_SITE_OTHER): Payer: Commercial Managed Care - HMO | Admitting: Urology

## 2016-01-06 ENCOUNTER — Encounter: Payer: Self-pay | Admitting: Urology

## 2016-01-06 VITALS — BP 124/62 | HR 88 | Ht 64.0 in | Wt 168.0 lb

## 2016-01-06 DIAGNOSIS — N471 Phimosis: Secondary | ICD-10-CM

## 2016-01-06 NOTE — Progress Notes (Signed)
01/06/2016 4:07 PM   [name] 29-Mar-1946 [id number]  Referring provider: Marina Goodell, MD [address] [facility] [place], [place] [address]  Chief Complaint  Patient presents with  . Circumcision    HPI: 70 year old male with phimosis and BPH. He presents to the office today for circumcision.  Prior to the procedure today, all of his questions were answered. He understood the risks and benefits of circumcision as well as the postoperative care.    His aspirin and Plavix has been held prior to the procedure today.   PMH: Past Medical History  Diagnosis Date  . Hyperlipidemia   . Cardiomyopathy, dilated (HCC)   . Single vessel coronary artery disease   . Acute prostatitis   . HLD (hyperlipidemia)   . HTN (hypertension)   . Systolic CHF (HCC)   . Paroxysmal ventricular fibrillation (HCC)   . Phimosis   . Nocturia   . Benign enlargement of prostate   . Incomplete bladder emptying   . Frequency   . Balanitis     Surgical History: Past Surgical History  Procedure Laterality Date  . Replacement total knee    . Defribrillator      Implant  . Shoulder arthroscopy with open rotator cuff repair Right 08/06/2015    Procedure: SHOULDER ARTHROSCOPY WITH MINI OPEN ROTATOR CUFF REPAIR, SUBACROMIAL DECOPRESSION, RELEASE LONGHEAD BICEP TENDON ;  Surgeon: Erin Sons, MD;  Location: [facility] ORS;  Service: Orthopedics;  Laterality: Right;    Home Medications:    Medication List       This list is accurate as of: 01/06/16  4:07 PM.  Always use your most recent med list.               amiodarone 200 MG tablet  Commonly known as:  PACERONE  Take 200 mg by mouth daily.     aspirin EC 81 MG tablet  Take 81 mg by mouth daily. Reported on 01/06/2016     atorvastatin 40 MG tablet  Commonly known as:  LIPITOR  Take 40 mg by mouth daily.     carvedilol 12.5 MG tablet  Commonly known as:  COREG  Take 12.5 mg by mouth 2 (two) times  daily with a meal.     clopidogrel 75 MG tablet  Commonly known as:  PLAVIX  Take 75 mg by mouth daily. Reported on 01/06/2016     enalapril 2.5 MG tablet  Commonly known as:  VASOTEC  Take 2.5 mg by mouth daily.     finasteride 5 MG tablet  Commonly known as:  PROSCAR  Take 5 mg by mouth daily.     fluticasone 50 MCG/ACT nasal spray  Commonly known as:  FLONASE  Place into both nostrils daily.     furosemide 40 MG tablet  Commonly known as:  LASIX  Take 40 mg by mouth daily.     HYDROcodone-acetaminophen 5-325 MG tablet  Commonly known as:  NORCO/VICODIN  Take 1 tablet by mouth every 6 (six) hours as needed for moderate pain. Reported on 12/04/2015     HYDROcodone-homatropine 5-1.5 MG/5ML syrup  Commonly known as:  HYCODAN  Take by mouth. Reported on 12/04/2015     KLOR-CON M20 20 MEQ tablet  Generic drug:  potassium chloride SA  Reported on 12/04/2015     nystatin-triamcinolone ointment  Commonly known as:  MYCOLOG  Apply 1 application topically 2 (two) times daily.     ROXICET 5-325 MG tablet  Generic drug:  oxyCODONE-acetaminophen  Take 1-2 tablets by mouth every 6 (six) hours as needed for severe pain.     sertraline 25 MG tablet  Commonly known as:  ZOLOFT  Take 25 mg by mouth daily.     spironolactone 12.5 mg Tabs tablet  Commonly known as:  ALDACTONE  Take 12.5 mg by mouth daily.     tamsulosin 0.4 MG Caps capsule  Commonly known as:  FLOMAX  Take 0.4 mg by mouth daily.        Allergies: No Known Allergies  Family History: Family History  Problem Relation Age of Onset  . Kidney disease Mother   . Prostate cancer Neg Hx   . Bladder Cancer Mother     Social History:  reports that he has quit smoking. He does not have any smokeless tobacco history on file. He reports that he does not drink alcohol or use illicit drugs.  Physical Exam: BP 124/62 mmHg  Pulse 88  Ht  (1.626 m)  Wt 168 lb (76.204 kg)  BMI 28.82 kg/m2  Constitutional:   Alert and oriented, No acute distress. HEENT: [place] AT, moist mucus membranes.  Trachea midline, no masses. Cardiovascular: No clubbing, cyanosis, or edema. Respiratory: Normal respiratory effort, no increased work of breathing. GI: Abdomen is soft, nontender, nondistended, no abdominal masses GU: No CVA tenderness. Phimotic foreskin. Skin: No rashes, bruises or suspicious lesions. Neurologic: Grossly intact, no focal deficits, moving all 4 extremities. Psychiatric: Normal mood and affect.  Laboratory Data: Lab Results  Component Value Date   WBC 6.6 07/28/2015   HGB 12.7* 07/28/2015   HCT 38.2* 07/28/2015   MCV 95.2 07/28/2015   PLT 248 07/28/2015    Lab Results  Component Value Date   CREATININE 1.15 07/28/2015     Lab Results  Component Value Date   HGBA1C 6.3 04/24/2014   Circumcision Procedure   Informed consistent obtained.  Risks discussed.  He was then prepped and draped in a sterile fashion.    Pre-Procedure: -A dorsal penile and ring block were administered using 20 cc 1% lidocaine.  The surgical site was then tested and adequate anesthesia was achieved.    Procedure: - Forceps were used to stretch the phimotic foreskin and expose the coronal area exposing the glans and meatus. -Two circumferential incision were made on the penile skin, one in the mid shaft and another approximately 1 cm proximal to the coronal margin using a blade. -The foreskin was then removed in a sleeve-like fashion with care taken to avoid injury to the urethra.  - Bleeding points were cauterized and adequate hemostasis was achieved - Skin margins were reapproximated with interrupted 3-0 chromic catgut sutures. -A dressing of Xeroform and gauze was applied.     Post-Procedure: -RTC in 4 weeks for wound check   Assessment & Plan:    1. Phimosis S/p uncomplicated circ today Post procedure instruction reviewed OK to restart ASA/ plavix in 2 days if no bleeding   Return in about 4  weeks (around 02/03/2016) for [facility] wound check/ discuss urinary symptoms.  Vanna Scotland, MD  [facility] [address], [address] [place], [place] [address] [phone]

## 2016-02-03 ENCOUNTER — Encounter: Payer: Self-pay | Admitting: Urology

## 2016-02-03 ENCOUNTER — Ambulatory Visit (INDEPENDENT_AMBULATORY_CARE_PROVIDER_SITE_OTHER): Payer: Commercial Managed Care - HMO | Admitting: Urology

## 2016-02-03 ENCOUNTER — Other Ambulatory Visit: Payer: Self-pay | Admitting: Urology

## 2016-02-03 VITALS — BP 128/71 | HR 69 | Ht 64.0 in | Wt 175.4 lb

## 2016-02-03 DIAGNOSIS — N528 Other male erectile dysfunction: Secondary | ICD-10-CM | POA: Diagnosis not present

## 2016-02-03 DIAGNOSIS — N480 Leukoplakia of penis: Secondary | ICD-10-CM | POA: Diagnosis not present

## 2016-02-03 DIAGNOSIS — N400 Enlarged prostate without lower urinary tract symptoms: Secondary | ICD-10-CM

## 2016-02-03 DIAGNOSIS — N471 Phimosis: Secondary | ICD-10-CM | POA: Diagnosis not present

## 2016-02-03 DIAGNOSIS — N529 Male erectile dysfunction, unspecified: Secondary | ICD-10-CM

## 2016-02-03 MED ORDER — BETAMETHASONE DIPROPIONATE 0.05 % EX CREA: TOPICAL_CREAM | Freq: Two times a day (BID) | CUTANEOUS | Status: DC

## 2016-02-03 MED ORDER — SILDENAFIL CITRATE 20 MG PO TABS: ORAL_TABLET | ORAL | Status: DC

## 2016-02-03 MED ORDER — SILDENAFIL CITRATE 100 MG PO TABS: 100.0000 mg | ORAL_TABLET | Freq: Every day | ORAL | Status: DC | PRN

## 2016-02-03 NOTE — Progress Notes (Signed)
9:17 AM   [name] [birth year] [id number]  Referring provider: Marina Goodell, MD [address] [facility] [place], [place] [address]  Chief Complaint  Patient presents with  . Follow-up    circumcision     HPI: Mr. Burgo is a 70 year old white male with phimosis, nocturia and BPH with LUTS who presents today for a follow-up after undergoing circumcision with Dr. Apolinar Junes on 01/06/2016.  Phimosis Patient underwent circumcision with Dr. Apolinar Junes on 01/06/2016.  He is pleased with the results.  He states the circumcision is well-healed.  He is experiencing a spraying of the urinary stream since the circumcision.  He would like that evaluated.  BPH WITH LUTS Patient will returning in May 2017 for his I PSS score, exam and PSA.  Nocturia Patient will returning in May 2017 for his I PSS score, exam and PSA.  Erectile dysfunction Patient has been experiencing erectile dysfunction for the last several years.  He would like to try PDE 5 inhibitor to see if this would provide benefit.  PMH: Past Medical History  Diagnosis Date  . Hyperlipidemia   . Cardiomyopathy, dilated (HCC)   . Single vessel coronary artery disease   . Acute prostatitis   . HLD (hyperlipidemia)   . HTN (hypertension)   . Systolic CHF (HCC)   . Paroxysmal ventricular fibrillation (HCC)   . Phimosis   . Nocturia   . Benign enlargement of prostate   . Incomplete bladder emptying   . Frequency   . Balanitis     Surgical History: Past Surgical History  Procedure Laterality Date  . Replacement total knee    . Defribrillator      Implant  . Shoulder arthroscopy with open rotator cuff repair Right 08/06/2015    Procedure: SHOULDER ARTHROSCOPY WITH MINI OPEN ROTATOR CUFF REPAIR, SUBACROMIAL DECOPRESSION, RELEASE LONGHEAD BICEP TENDON ;  Surgeon: Erin Sons, MD;  Location: [facility] ORS;  Service: Orthopedics;  Laterality: Right;    Home Medications:      Medication List       This list is accurate as of: 02/03/16  9:17 AM.  Always use your most recent med list.               amiodarone 200 MG tablet  Commonly known as:  PACERONE  Take 200 mg by mouth daily.     aspirin EC 81 MG tablet  Take 81 mg by mouth daily. Reported on 01/06/2016     atorvastatin 40 MG tablet  Commonly known as:  LIPITOR  Take 40 mg by mouth daily.     betamethasone dipropionate 0.05 % cream  Commonly known as:  DIPROLENE  Apply topically 2 (two) times daily.     carvedilol 12.5 MG tablet  Commonly known as:  COREG  Take 12.5 mg by mouth 2 (two) times daily with a meal.     clopidogrel 75 MG tablet  Commonly known as:  PLAVIX  Take 75 mg by mouth daily. Reported on 01/06/2016     enalapril 2.5 MG tablet  Commonly known as:  VASOTEC  Take 2.5 mg by mouth daily.     finasteride 5 MG tablet  Commonly known as:  PROSCAR  Take 5 mg by mouth daily.     fluticasone 50 MCG/ACT nasal spray  Commonly known as:  FLONASE  Place into both nostrils daily.     furosemide 40 MG tablet  Commonly known as:  LASIX  Take 40 mg by mouth daily.     KLOR-CON M20 20 MEQ tablet  Generic drug:  potassium chloride SA  Reported on 12/04/2015     sertraline 25 MG tablet  Commonly known as:  ZOLOFT  Take 25 mg by mouth daily.     sildenafil 100 MG tablet  Commonly known as:  VIAGRA  Take 1 tablet (100 mg total) by mouth daily as needed for erectile dysfunction.     sildenafil 20 MG tablet  Commonly known as:  REVATIO  Take 3 to 5 tablets two hours before intercouse on an empty stomach.  Do not take with nitrates.     spironolactone 12.5 mg Tabs tablet  Commonly known as:  ALDACTONE  Take 12.5 mg by mouth daily.     tamsulosin 0.4 MG Caps capsule  Commonly known as:  FLOMAX  Take 0.4 mg by mouth daily.        Allergies: No Known Allergies  Family History: Family History  Problem Relation Age of Onset  . Kidney disease Mother   . Prostate cancer  Neg Hx   . Bladder Cancer Mother     Social History:  reports that he has quit smoking. He does not have any smokeless tobacco history on file. He reports that he does not drink alcohol or use illicit drugs.  ROS: UROLOGY Frequent Urination?: Yes Hard to postpone urination?: No Burning/pain with urination?: No Get up at night to urinate?: Yes Leakage of urine?: No Urine stream starts and stops?: Yes Trouble starting stream?: Yes Do you have to strain to urinate?: No Blood in urine?: No Urinary tract infection?: No Sexually transmitted disease?: No Injury to kidneys or bladder?: No Painful intercourse?: No Weak stream?: Yes Erection problems?: No Penile pain?: No  Gastrointestinal Nausea?: No Vomiting?: No Indigestion/heartburn?: No Diarrhea?: No Constipation?: No  Constitutional Fever: No Night sweats?: No Weight loss?: No Fatigue?: No  Skin Skin rash/lesions?: No Itching?: No  Eyes Blurred vision?: No Double vision?: No  Ears/Nose/Throat Sore throat?: No Sinus problems?: Yes  Hematologic/Lymphatic Swollen glands?: No Easy bruising?: Yes  Cardiovascular Leg swelling?: No Chest pain?: No  Respiratory Cough?: No Shortness of breath?: Yes  Endocrine Excessive thirst?: No  Musculoskeletal Back pain?: No Joint pain?: No  Neurological Headaches?: No Dizziness?: No  Psychologic Depression?: No Anxiety?: No  Physical Exam: BP 128/71 mmHg  Pulse 69  Ht  (1.626 m)  Wt 175 lb 6.4 oz (79.561 kg)  BMI 30.09 kg/m2  GU: Patient with circumcised phallus.   Circumcision incisions are clean and dry and well-healed.   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  Urethral meatus is patent.  No penile discharge. No penile lesions or rashes.   Laboratory Data: Lab Results  Component Value Date   WBC 6.6 07/28/2015   HGB 12.7* 07/28/2015   HCT 38.2* 07/28/2015   MCV 95.2 07/28/2015   PLT 248 07/28/2015    Lab Results  Component Value Date   CREATININE  1.15 07/28/2015   PSA History:    2.0 ng/mL on 05/20/2014    1.0 ng/mL on 12/02/2014    0.4 ng/mL on 07/09/2015    0.3 ng/mL on 12/04/2015   Lab Results  Component Value Date   HGBA1C 6.3 04/24/2014    Assessment & Plan:    1. Phimosis:   Patient underwent circumcision on 01/16/2016 with Dr. Apolinar Junes. Phimosis is resolved.  2. BXO:   Patient is prescribed betamethasone dipropionate 0.05% cream to apply twice  daily to his glands.  He will be returning in May 2017 for exam and symptom recheck.  3.  BPH with LUTS:   Patient will returning in May 2017 for his I PSS score, exam and PSA.  4. Nocturia:   Patient will returning in May 2017 for his I PSS score, exam and PSA.  5. Erectile dysfunction:   Patient would like to try a PDE5-inhibitor.  I have given him written prescriptions for sildenafil and Viagra along with the coupon for the Viagra.  His insurance does not cover these types of medications, so he will comparison shop with these two prescriptions.  He is warned not to take the sildenafil with medications that contain nitrates.  I also advised him of the side effects, such as: headache, flushing, dyspepsia, abnormal vision, nasal congestion, back pain, myalgia, nausea, dizziness, and rash.  He is to take the sildenafil two hours prior to intercourse on an empty stomach.  If he is taking the sildenafil 20 mg, he may need to take up to 5 tablets to achieve good results.  He will be returning in May 2017 for SHIM score and exam.     Return in about 3 months (around 05/02/2016) for IPSS score, SHIM score, PVR and exam.  Michiel Cowboy, [facility]  [facility] [address], [address] [place], [place] [address] [phone]

## 2016-02-16 DIAGNOSIS — J40 Bronchitis, not specified as acute or chronic: Secondary | ICD-10-CM | POA: Diagnosis not present

## 2016-02-16 DIAGNOSIS — J0100 Acute maxillary sinusitis, unspecified: Secondary | ICD-10-CM | POA: Diagnosis not present

## 2016-03-02 DIAGNOSIS — Z9889 Other specified postprocedural states: Secondary | ICD-10-CM | POA: Diagnosis not present

## 2016-03-15 ENCOUNTER — Other Ambulatory Visit: Payer: Self-pay | Admitting: Urology

## 2016-03-30 DIAGNOSIS — I471 Supraventricular tachycardia: Secondary | ICD-10-CM | POA: Diagnosis not present

## 2016-04-13 DIAGNOSIS — F418 Other specified anxiety disorders: Secondary | ICD-10-CM | POA: Diagnosis not present

## 2016-04-13 DIAGNOSIS — I420 Dilated cardiomyopathy: Secondary | ICD-10-CM | POA: Diagnosis not present

## 2016-04-13 DIAGNOSIS — E7800 Pure hypercholesterolemia, unspecified: Secondary | ICD-10-CM | POA: Diagnosis not present

## 2016-04-13 DIAGNOSIS — N138 Other obstructive and reflux uropathy: Secondary | ICD-10-CM | POA: Diagnosis not present

## 2016-04-13 DIAGNOSIS — I10 Essential (primary) hypertension: Secondary | ICD-10-CM | POA: Diagnosis not present

## 2016-04-13 DIAGNOSIS — I429 Cardiomyopathy, unspecified: Secondary | ICD-10-CM | POA: Diagnosis not present

## 2016-04-13 DIAGNOSIS — N401 Enlarged prostate with lower urinary tract symptoms: Secondary | ICD-10-CM | POA: Diagnosis not present

## 2016-04-13 DIAGNOSIS — I5022 Chronic systolic (congestive) heart failure: Secondary | ICD-10-CM | POA: Diagnosis not present

## 2016-04-15 DIAGNOSIS — I4891 Unspecified atrial fibrillation: Secondary | ICD-10-CM | POA: Diagnosis not present

## 2016-04-15 DIAGNOSIS — I429 Cardiomyopathy, unspecified: Secondary | ICD-10-CM | POA: Diagnosis not present

## 2016-04-15 DIAGNOSIS — Z79899 Other long term (current) drug therapy: Secondary | ICD-10-CM | POA: Diagnosis not present

## 2016-04-15 DIAGNOSIS — E7800 Pure hypercholesterolemia, unspecified: Secondary | ICD-10-CM | POA: Diagnosis not present

## 2016-04-15 DIAGNOSIS — I420 Dilated cardiomyopathy: Secondary | ICD-10-CM | POA: Diagnosis not present

## 2016-04-15 DIAGNOSIS — I5022 Chronic systolic (congestive) heart failure: Secondary | ICD-10-CM | POA: Diagnosis not present

## 2016-04-15 DIAGNOSIS — R918 Other nonspecific abnormal finding of lung field: Secondary | ICD-10-CM | POA: Diagnosis not present

## 2016-04-15 DIAGNOSIS — I10 Essential (primary) hypertension: Secondary | ICD-10-CM | POA: Diagnosis not present

## 2016-04-26 ENCOUNTER — Other Ambulatory Visit: Payer: Self-pay

## 2016-04-26 DIAGNOSIS — N400 Enlarged prostate without lower urinary tract symptoms: Secondary | ICD-10-CM

## 2016-04-27 ENCOUNTER — Other Ambulatory Visit: Payer: Commercial Managed Care - HMO

## 2016-04-28 ENCOUNTER — Other Ambulatory Visit: Payer: Commercial Managed Care - HMO

## 2016-04-28 DIAGNOSIS — N400 Enlarged prostate without lower urinary tract symptoms: Secondary | ICD-10-CM | POA: Diagnosis not present

## 2016-04-29 DIAGNOSIS — Z79899 Other long term (current) drug therapy: Secondary | ICD-10-CM | POA: Diagnosis not present

## 2016-04-29 LAB — PSA: PROSTATE SPECIFIC AG, SERUM: 0.2 ng/mL (ref 0.0–4.0)

## 2016-05-04 ENCOUNTER — Ambulatory Visit (INDEPENDENT_AMBULATORY_CARE_PROVIDER_SITE_OTHER): Payer: Commercial Managed Care - HMO | Admitting: Urology

## 2016-05-04 ENCOUNTER — Encounter: Payer: Self-pay | Admitting: Urology

## 2016-05-04 VITALS — BP 106/65 | HR 85 | Ht 64.0 in | Wt 176.7 lb

## 2016-05-04 DIAGNOSIS — R351 Nocturia: Secondary | ICD-10-CM

## 2016-05-04 DIAGNOSIS — N529 Male erectile dysfunction, unspecified: Secondary | ICD-10-CM

## 2016-05-04 DIAGNOSIS — N138 Other obstructive and reflux uropathy: Secondary | ICD-10-CM

## 2016-05-04 DIAGNOSIS — N471 Phimosis: Secondary | ICD-10-CM

## 2016-05-04 DIAGNOSIS — N401 Enlarged prostate with lower urinary tract symptoms: Secondary | ICD-10-CM

## 2016-05-04 LAB — BLADDER SCAN AMB NON-IMAGING: SCAN RESULT: 78

## 2016-05-04 MED ORDER — TAMSULOSIN HCL 0.4 MG PO CAPS: 0.4000 mg | ORAL_CAPSULE | Freq: Every day | ORAL | Status: DC

## 2016-05-04 NOTE — Progress Notes (Signed)
10:36 AM   [name] [birth year] [id number]  Referring provider: Marina Goodell, MD [address] [facility] - PRIMARY CARE [place], [place] [address]  Chief Complaint  Patient presents with  . Phimosis    3 month follow up  . Erectile Dysfunction    HPI: Mr. Rumble is a 70 year old white male with nocturia, ED and BPH with LUTS who presents today for a 3 month follow-up.  BPH WITH LUTS His IPSS score today is 11, which is moderate lower urinary tract symptomatology. He is pleased with his quality life due to his urinary symptoms.  His PVR is 78 mL.  His previous IPSS was 18/3.   His previous PVR is 36 mL.   His major complaint today intermittency, weak stream and nocturia x 1.  He has had these symptoms for several years.  He denies any dysuria, hematuria or suprapubic pain.   He currently taking tamsulosin 0.4 mg daily and finasteride 5 mg daily.  His has had urethral dilations in the past.  He also denies any recent fevers, chills, nausea or vomiting.   He does not have a family history of PCa.      IPSS      05/04/16 1000       International Prostate Symptom Score   How often have you had the sensation of not emptying your bladder? Less than 1 in 5     How often have you had to urinate less than every two hours? Less than 1 in 5 times     How often have you found you stopped and started again several times when you urinated? About half the time     How often have you found it difficult to postpone urination? Less than 1 in 5 times     How often have you had a weak urinary stream? About half the time     How often have you had to strain to start urination? Less than 1 in 5 times     How many times did you typically get up at night to urinate? 1 Time     Total IPSS Score 11     Quality of Life due to urinary symptoms   If you were to spend the rest of your life with your urinary condition just the way it is now how would you feel about that? Pleased         Score:  1-7 Mild 8-19 Moderate 20-35 Severe  Nocturia Patient is now only getting up once a night.  Erectile dysfunction His SHIM score is 4, which is severe erectile dysfunction.   He has been having difficulty with erections for several years.   His major complaint is he does not have a partner.  His libido is preserved.   His risk factors for ED are age, BPH, HTN, heart failure and HLD.    He denies any painful erections or curvatures with his erections.        SHIM      05/04/16 1004       SHIM: Over the last 6 months:   How do you rate your confidence that you could get and keep an erection? Moderate     When you had erections with sexual stimulation, how often were your erections hard enough for penetration (entering your partner)? Almost Never or Never     During sexual intercourse, how often were you able to maintain your  erection after you had penetrated (entered) your partner? Extremely Difficult     During sexual intercourse, how difficult was it to maintain your erection to completion of intercourse? Extremely Difficult     When you attempted sexual intercourse, how often was it satisfactory for you? Extremely Difficult     SHIM Total Score   SHIM 7        Score: 1-7 Severe ED 8-11 Moderate ED 12-16 Mild-Moderate ED 17-21 Mild ED 22-25 No ED        PMH: Past Medical History  Diagnosis Date  . Hyperlipidemia   . Cardiomyopathy, dilated (HCC)   . Single vessel coronary artery disease   . Acute prostatitis   . HLD (hyperlipidemia)   . HTN (hypertension)   . Systolic CHF (HCC)   . Paroxysmal ventricular fibrillation (HCC)   . Phimosis   . Nocturia   . Benign enlargement of prostate   . Incomplete bladder emptying   . Frequency   . Balanitis     Surgical History: Past Surgical History  Procedure Laterality Date  . Replacement total knee    . Defribrillator      Implant  . Shoulder arthroscopy with open rotator cuff repair Right  08/06/2015    Procedure: SHOULDER ARTHROSCOPY WITH MINI OPEN ROTATOR CUFF REPAIR, SUBACROMIAL DECOPRESSION, RELEASE LONGHEAD BICEP TENDON ;  Surgeon: Erin SonsHarold Kernodle, MD;  Location: [facility] ORS;  Service: Orthopedics;  Laterality: Right;    Home Medications:    Medication List       This list is accurate as of: 05/04/16 10:36 AM.  Always use your most recent med list.               amiodarone 200 MG tablet  Commonly known as:  PACERONE  Take 200 mg by mouth daily.     aspirin EC 81 MG tablet  Take 81 mg by mouth daily. Reported on 01/06/2016     atorvastatin 40 MG tablet  Commonly known as:  LIPITOR  Take 40 mg by mouth daily.     betamethasone dipropionate 0.05 % cream  Commonly known as:  DIPROLENE  Apply topically 2 (two) times daily.     carvedilol 12.5 MG tablet  Commonly known as:  COREG  Take 12.5 mg by mouth 2 (two) times daily with a meal.     clopidogrel 75 MG tablet  Commonly known as:  PLAVIX  Take 75 mg by mouth daily. Reported on 01/06/2016     enalapril 2.5 MG tablet  Commonly known as:  VASOTEC  Take 2.5 mg by mouth daily.     finasteride 5 MG tablet  Commonly known as:  PROSCAR  TAKE 1 TABLET BY MOUTH DAILY     fluticasone 50 MCG/ACT nasal spray  Commonly known as:  FLONASE  Place into both nostrils daily.     furosemide 40 MG tablet  Commonly known as:  LASIX  Take 40 mg by mouth daily.     KLOR-CON M20 20 MEQ tablet  Generic drug:  potassium chloride SA  Reported on 05/04/2016     sertraline 25 MG tablet  Commonly known as:  ZOLOFT  Take 25 mg by mouth daily.     sildenafil 100 MG tablet  Commonly known as:  VIAGRA  Take 1 tablet (100 mg total) by mouth daily as needed for erectile dysfunction.     sildenafil 20 MG tablet  Commonly known as:  REVATIO  Take 3 to 5 tablets two hours before  intercouse on an empty stomach.  Do not take with nitrates.     spironolactone 12.5 mg Tabs tablet  Commonly known as:  ALDACTONE  Take 12.5 mg by  mouth daily.     tamsulosin 0.4 MG Caps capsule  Commonly known as:  FLOMAX  Take 1 capsule (0.4 mg total) by mouth daily.     VENTOLIN HFA 108 (90 Base) MCG/ACT inhaler  Generic drug:  albuterol  Inhale into the lungs.        Allergies: No Known Allergies  Family History: Family History  Problem Relation Age of Onset  . Kidney disease Mother   . Prostate cancer Neg Hx   . Bladder Cancer Mother     Social History:  reports that he has quit smoking. He does not have any smokeless tobacco history on file. He reports that he does not drink alcohol or use illicit drugs.  ROS: UROLOGY Frequent Urination?: No Hard to postpone urination?: No Burning/pain with urination?: No Get up at night to urinate?: No Leakage of urine?: No Urine stream starts and stops?: No Trouble starting stream?: No Do you have to strain to urinate?: No Blood in urine?: No Urinary tract infection?: No Sexually transmitted disease?: No Injury to kidneys or bladder?: No Painful intercourse?: No Weak stream?: No Erection problems?: No Penile pain?: No  Gastrointestinal Nausea?: No Vomiting?: No Indigestion/heartburn?: No Diarrhea?: No Constipation?: No  Constitutional Fever: No Night sweats?: No Weight loss?: No Fatigue?: No  Skin Skin rash/lesions?: No Itching?: No  Eyes Blurred vision?: No Double vision?: No  Ears/Nose/Throat Sore throat?: No Sinus problems?: No  Hematologic/Lymphatic Swollen glands?: No Easy bruising?: No  Cardiovascular Leg swelling?: No Chest pain?: No  Respiratory Cough?: No Shortness of breath?: No  Endocrine Excessive thirst?: No  Musculoskeletal Back pain?: No Joint pain?: No  Neurological Headaches?: No Dizziness?: No  Psychologic Depression?: No Anxiety?: No  Physical Exam: BP 106/65 mmHg  Pulse 85  Ht 5\' 4"  (1.626 m)  Wt 176 lb 11.2 oz (80.151 kg)  BMI 30.32 kg/m2  GU: Patient with circumcised phallus.   Urethral meatus is  patent.  No penile discharge. No penile lesions or rashes. Scrotum without lesions, cysts, rashes and/or edema.  Testicles are located scrotally bilaterally. No masses are appreciated in the testicles. Left and right epididymis are normal. Rectal: Patient with  normal sphincter tone. Perineum without scarring or rashes. No rectal masses are appreciated. Prostate is approximately 50 grams, no nodules are appreciated. Seminal vesicles are normal.   Laboratory Data: Results for orders placed or performed in visit on 05/04/16  BLADDER SCAN AMB NON-IMAGING  Result Value Ref Range   Scan Result 78    Lab Results  Component Value Date   WBC 6.6 07/28/2015   HGB 12.7* 07/28/2015   HCT 38.2* 07/28/2015   MCV 95.2 07/28/2015   PLT 248 07/28/2015    Lab Results  Component Value Date   CREATININE 1.15 07/28/2015   PSA History:  2.0 ng/mL on 05/20/2014  1.0 ng/mL on 12/02/2014  0.4 ng/mL on 07/09/2015  0.3 ng/mL on 12/04/2015  0.2 ng/mL on 04/28/2016   Lab Results  Component Value Date   HGBA1C 6.3 04/24/2014    Persistent imaging Results for orders placed or performed in visit on 05/04/16  BLADDER SCAN AMB NON-IMAGING  Result Value Ref Range   Scan Result 78      Assessment & Plan:    1.  BPH with LUTS:   IPSS score 11/1  Patient is currently on tamsulosin and finasteride. He will continue those medications.  He will return to the office in 12 months time for IPSS score, exam and PSA.  2. Nocturia:   Patient is having nocturia  1.  He has implemented some behavioral and dietary changes.  We will continue to monitor.  He will RTC in 12 months for a PSA, exam and IPSS score.    3. Erectile dysfunction:   SHIM score is 4.  He would like to table this discussion until he finds a willing partner.   Return in about 1 year (around 05/04/2017) for IPSS score and exam.  Michiel Cowboy, [facility] [address], [address] [place], [place]  [address] [phone]

## 2016-06-29 DIAGNOSIS — I471 Supraventricular tachycardia: Secondary | ICD-10-CM | POA: Diagnosis not present

## 2016-07-01 DIAGNOSIS — I420 Dilated cardiomyopathy: Secondary | ICD-10-CM | POA: Diagnosis not present

## 2016-07-01 DIAGNOSIS — I429 Cardiomyopathy, unspecified: Secondary | ICD-10-CM | POA: Diagnosis not present

## 2016-07-01 DIAGNOSIS — I480 Paroxysmal atrial fibrillation: Secondary | ICD-10-CM | POA: Diagnosis not present

## 2016-07-01 DIAGNOSIS — J069 Acute upper respiratory infection, unspecified: Secondary | ICD-10-CM | POA: Diagnosis not present

## 2016-07-01 DIAGNOSIS — I4901 Ventricular fibrillation: Secondary | ICD-10-CM | POA: Diagnosis not present

## 2016-07-01 DIAGNOSIS — E7800 Pure hypercholesterolemia, unspecified: Secondary | ICD-10-CM | POA: Diagnosis not present

## 2016-07-01 DIAGNOSIS — I10 Essential (primary) hypertension: Secondary | ICD-10-CM | POA: Diagnosis not present

## 2016-09-13 DIAGNOSIS — J019 Acute sinusitis, unspecified: Secondary | ICD-10-CM | POA: Diagnosis not present

## 2016-09-21 DIAGNOSIS — E7800 Pure hypercholesterolemia, unspecified: Secondary | ICD-10-CM | POA: Diagnosis not present

## 2016-09-21 DIAGNOSIS — I4901 Ventricular fibrillation: Secondary | ICD-10-CM | POA: Diagnosis not present

## 2016-09-21 DIAGNOSIS — I429 Cardiomyopathy, unspecified: Secondary | ICD-10-CM | POA: Diagnosis not present

## 2016-09-21 DIAGNOSIS — I10 Essential (primary) hypertension: Secondary | ICD-10-CM | POA: Diagnosis not present

## 2016-09-21 DIAGNOSIS — I480 Paroxysmal atrial fibrillation: Secondary | ICD-10-CM | POA: Diagnosis not present

## 2016-09-21 DIAGNOSIS — I5022 Chronic systolic (congestive) heart failure: Secondary | ICD-10-CM | POA: Diagnosis not present

## 2016-09-21 DIAGNOSIS — I420 Dilated cardiomyopathy: Secondary | ICD-10-CM | POA: Diagnosis not present

## 2016-09-28 DIAGNOSIS — I471 Supraventricular tachycardia: Secondary | ICD-10-CM | POA: Diagnosis not present

## 2016-10-13 DIAGNOSIS — J0100 Acute maxillary sinusitis, unspecified: Secondary | ICD-10-CM | POA: Diagnosis not present

## 2016-10-13 DIAGNOSIS — F418 Other specified anxiety disorders: Secondary | ICD-10-CM | POA: Diagnosis not present

## 2016-10-13 DIAGNOSIS — E7800 Pure hypercholesterolemia, unspecified: Secondary | ICD-10-CM | POA: Diagnosis not present

## 2016-10-13 DIAGNOSIS — I10 Essential (primary) hypertension: Secondary | ICD-10-CM | POA: Diagnosis not present

## 2016-10-13 DIAGNOSIS — I429 Cardiomyopathy, unspecified: Secondary | ICD-10-CM | POA: Diagnosis not present

## 2016-10-13 DIAGNOSIS — R3912 Poor urinary stream: Secondary | ICD-10-CM | POA: Diagnosis not present

## 2016-10-13 DIAGNOSIS — N401 Enlarged prostate with lower urinary tract symptoms: Secondary | ICD-10-CM | POA: Diagnosis not present

## 2016-12-02 ENCOUNTER — Other Ambulatory Visit: Payer: Self-pay | Admitting: Urology

## 2016-12-02 DIAGNOSIS — N401 Enlarged prostate with lower urinary tract symptoms: Secondary | ICD-10-CM

## 2016-12-28 DIAGNOSIS — I471 Supraventricular tachycardia: Secondary | ICD-10-CM | POA: Diagnosis not present

## 2017-01-04 DIAGNOSIS — I5022 Chronic systolic (congestive) heart failure: Secondary | ICD-10-CM | POA: Diagnosis not present

## 2017-01-04 DIAGNOSIS — Z8679 Personal history of other diseases of the circulatory system: Secondary | ICD-10-CM | POA: Diagnosis not present

## 2017-01-04 DIAGNOSIS — E7800 Pure hypercholesterolemia, unspecified: Secondary | ICD-10-CM | POA: Diagnosis not present

## 2017-01-04 DIAGNOSIS — I429 Cardiomyopathy, unspecified: Secondary | ICD-10-CM | POA: Diagnosis not present

## 2017-01-04 DIAGNOSIS — F418 Other specified anxiety disorders: Secondary | ICD-10-CM | POA: Diagnosis not present

## 2017-01-04 DIAGNOSIS — R0602 Shortness of breath: Secondary | ICD-10-CM | POA: Diagnosis not present

## 2017-01-04 DIAGNOSIS — I4901 Ventricular fibrillation: Secondary | ICD-10-CM | POA: Diagnosis not present

## 2017-01-04 DIAGNOSIS — I2510 Atherosclerotic heart disease of native coronary artery without angina pectoris: Secondary | ICD-10-CM | POA: Diagnosis not present

## 2017-02-23 DIAGNOSIS — J329 Chronic sinusitis, unspecified: Secondary | ICD-10-CM | POA: Diagnosis not present

## 2017-02-23 DIAGNOSIS — J40 Bronchitis, not specified as acute or chronic: Secondary | ICD-10-CM | POA: Diagnosis not present

## 2017-02-23 DIAGNOSIS — J0100 Acute maxillary sinusitis, unspecified: Secondary | ICD-10-CM | POA: Diagnosis not present

## 2017-03-15 DIAGNOSIS — R05 Cough: Secondary | ICD-10-CM | POA: Diagnosis not present

## 2017-03-15 DIAGNOSIS — J0100 Acute maxillary sinusitis, unspecified: Secondary | ICD-10-CM | POA: Diagnosis not present

## 2017-03-29 DIAGNOSIS — I471 Supraventricular tachycardia: Secondary | ICD-10-CM | POA: Diagnosis not present

## 2017-04-06 DIAGNOSIS — I4901 Ventricular fibrillation: Secondary | ICD-10-CM | POA: Diagnosis not present

## 2017-04-06 DIAGNOSIS — I10 Essential (primary) hypertension: Secondary | ICD-10-CM | POA: Diagnosis not present

## 2017-04-06 DIAGNOSIS — I2510 Atherosclerotic heart disease of native coronary artery without angina pectoris: Secondary | ICD-10-CM | POA: Diagnosis not present

## 2017-04-06 DIAGNOSIS — J40 Bronchitis, not specified as acute or chronic: Secondary | ICD-10-CM | POA: Diagnosis not present

## 2017-04-06 DIAGNOSIS — I5022 Chronic systolic (congestive) heart failure: Secondary | ICD-10-CM | POA: Diagnosis not present

## 2017-04-06 DIAGNOSIS — I4891 Unspecified atrial fibrillation: Secondary | ICD-10-CM | POA: Diagnosis not present

## 2017-04-06 DIAGNOSIS — I420 Dilated cardiomyopathy: Secondary | ICD-10-CM | POA: Diagnosis not present

## 2017-04-06 DIAGNOSIS — E7800 Pure hypercholesterolemia, unspecified: Secondary | ICD-10-CM | POA: Diagnosis not present

## 2017-04-13 DIAGNOSIS — I420 Dilated cardiomyopathy: Secondary | ICD-10-CM | POA: Diagnosis not present

## 2017-04-13 DIAGNOSIS — I10 Essential (primary) hypertension: Secondary | ICD-10-CM | POA: Diagnosis not present

## 2017-04-13 DIAGNOSIS — N401 Enlarged prostate with lower urinary tract symptoms: Secondary | ICD-10-CM | POA: Diagnosis not present

## 2017-04-13 DIAGNOSIS — Z125 Encounter for screening for malignant neoplasm of prostate: Secondary | ICD-10-CM | POA: Diagnosis not present

## 2017-04-13 DIAGNOSIS — R3912 Poor urinary stream: Secondary | ICD-10-CM | POA: Diagnosis not present

## 2017-04-13 DIAGNOSIS — F418 Other specified anxiety disorders: Secondary | ICD-10-CM | POA: Diagnosis not present

## 2017-04-13 DIAGNOSIS — E7800 Pure hypercholesterolemia, unspecified: Secondary | ICD-10-CM | POA: Diagnosis not present

## 2017-04-23 ENCOUNTER — Other Ambulatory Visit: Payer: Self-pay | Admitting: Urology

## 2017-04-23 DIAGNOSIS — N401 Enlarged prostate with lower urinary tract symptoms: Principal | ICD-10-CM

## 2017-04-23 DIAGNOSIS — N138 Other obstructive and reflux uropathy: Secondary | ICD-10-CM

## 2017-05-03 ENCOUNTER — Other Ambulatory Visit: Payer: Commercial Managed Care - HMO

## 2017-05-10 ENCOUNTER — Ambulatory Visit: Payer: Commercial Managed Care - HMO | Admitting: Urology

## 2017-05-16 NOTE — Progress Notes (Signed)
10:26 AM   [name] 12/23/1945 [id number]  Referring provider: Marina Goodell, MD [address] [place], [place] [address]  Chief Complaint  Patient presents with  . Benign Prostatic Hypertrophy    1 year follow up   . Erectile Dysfunction    HPI: [name] is a 71 year old WM with nocturia, ED and BPH with LUTS who presents today for a 12 month follow-up.  BPH WITH LUTS His IPSS score today is 5, which is mild lower urinary tract symptomatology. He is pleased with his quality life due to his urinary symptoms.  His previous IPSS was 11/1.  His previous PVR is 78 mL.   His major complaint today intermittency, weak stream and nocturia x 1.  He has had these symptoms for several years.  He denies any dysuria, hematuria or suprapubic pain.   He currently taking tamsulosin 0.4 mg daily and finasteride 5 mg daily.  His has had urethral dilations in the past.  He also denies any recent fevers, chills, nausea or vomiting.   He does not have a family history of PCa.      IPSS    Row Name 05/17/17 0900         International Prostate Symptom Score   How often have you had the sensation of not emptying your bladder? Less than 1 in 5     How often have you had to urinate less than every two hours? Less than 1 in 5 times     How often have you found you stopped and started again several times when you urinated? Not at All     How often have you found it difficult to postpone urination? Not at All     How often have you had a weak urinary stream? Less than half the time     How often have you had to strain to start urination? Not at All     How many times did you typically get up at night to urinate? 1 Time     Total IPSS Score 5       Quality of Life due to urinary symptoms   If you were to spend the rest of your life with your urinary condition just the way it is now how would you feel about that? Pleased        Score:  1-7 Mild 8-19 Moderate 20-35  Severe  Nocturia Patient is now only getting up once a night.  Erectile dysfunction His SHIM score is 4, which is severe erectile dysfunction.   He has been having difficulty with erections for several years.   His major complaint is he does not have a partner.  His libido is preserved.   His risk factors for ED are age, BPH, HTN, heart failure and HLD.    He denies any painful erections or curvatures with his erections.      PMH: Past Medical History:  Diagnosis Date  . Acute prostatitis   . Balanitis   . Benign enlargement of prostate   . Cardiomyopathy, dilated (HCC)   . Frequency   . HLD (hyperlipidemia)   . HTN (hypertension)   . Hyperlipidemia   . Incomplete bladder emptying   . Nocturia   . Paroxysmal ventricular fibrillation (HCC)   . Phimosis   . Single vessel coronary artery disease   . Systolic CHF [facility])     Surgical History: Past Surgical History:  Procedure Laterality Date  .  defribrillator     Implant  . REPLACEMENT TOTAL KNEE    . SHOULDER ARTHROSCOPY WITH OPEN ROTATOR CUFF REPAIR Right 08/06/2015   Procedure: SHOULDER ARTHROSCOPY WITH MINI OPEN ROTATOR CUFF REPAIR, SUBACROMIAL DECOPRESSION, RELEASE LONGHEAD BICEP TENDON ;  Surgeon: Erin Sons, MD;  Location: [facility] ORS;  Service: Orthopedics;  Laterality: Right;    Home Medications:  Allergies as of 05/17/2017   No Known Allergies     Medication List       Accurate as of 05/17/17 10:26 AM. Always use your most recent med list.          amiodarone 200 MG tablet Commonly known as:  PACERONE Take 200 mg by mouth daily.   aspirin EC 81 MG tablet Take 81 mg by mouth daily. Reported on 01/06/2016   atorvastatin 40 MG tablet Commonly known as:  LIPITOR Take 40 mg by mouth daily.   betamethasone dipropionate 0.05 % cream Commonly known as:  DIPROLENE Apply topically 2 (two) times daily.   carvedilol 12.5 MG tablet Commonly known as:  COREG Take 12.5 mg by mouth 2 (two) times daily with a  meal.   clopidogrel 75 MG tablet Commonly known as:  PLAVIX Take 75 mg by mouth daily. Reported on 01/06/2016   enalapril 2.5 MG tablet Commonly known as:  VASOTEC Take 2.5 mg by mouth daily.   finasteride 5 MG tablet Commonly known as:  PROSCAR TAKE 1 TABLET BY MOUTH DAILY   fluticasone 50 MCG/ACT nasal spray Commonly known as:  FLONASE Place into both nostrils daily.   furosemide 40 MG tablet Commonly known as:  LASIX Take 40 mg by mouth daily.   HYDROcodone-acetaminophen 5-325 MG tablet Commonly known as:  NORCO/VICODIN Take by mouth.   KLOR-CON M20 20 MEQ tablet Generic drug:  potassium chloride SA Reported on 05/04/2016   sertraline 25 MG tablet Commonly known as:  ZOLOFT Take 25 mg by mouth daily.   sildenafil 100 MG tablet Commonly known as:  VIAGRA Take 1 tablet (100 mg total) by mouth daily as needed for erectile dysfunction.   sildenafil 20 MG tablet Commonly known as:  REVATIO Take 3 to 5 tablets two hours before intercouse on an empty stomach.  Do not take with nitrates.   spironolactone 12.5 mg Tabs tablet Commonly known as:  ALDACTONE Take 12.5 mg by mouth daily.   tamsulosin 0.4 MG Caps capsule Commonly known as:  FLOMAX TAKE 1 CAPSULE EVERY DAY   VENTOLIN HFA 108 (90 Base) MCG/ACT inhaler Generic drug:  albuterol Inhale into the lungs.       Allergies: No Known Allergies  Family History: Family History  Problem Relation Age of Onset  . Kidney disease Mother   . Bladder Cancer Mother   . Prostate cancer Neg Hx   . Kidney cancer Neg Hx     Social History:  reports that he quit smoking about 10 years ago. He has never used smokeless tobacco. He reports that he does not drink alcohol or use drugs.  ROS: UROLOGY Frequent Urination?: No Hard to postpone urination?: No Burning/pain with urination?: No Get up at night to urinate?: No Leakage of urine?: No Urine stream starts and stops?: No Trouble starting stream?: No Do you have to  strain to urinate?: No Blood in urine?: No Urinary tract infection?: No Sexually transmitted disease?: No Injury to kidneys or bladder?: No Painful intercourse?: No Weak stream?: No Erection problems?: No Penile pain?: No  Gastrointestinal Nausea?: No Vomiting?: No Indigestion/heartburn?: No  Diarrhea?: No Constipation?: No  Constitutional Fever: No Night sweats?: No Weight loss?: No Fatigue?: No  Skin Skin rash/lesions?: No Itching?: No  Eyes Blurred vision?: No Double vision?: No  Ears/Nose/Throat Sore throat?: No Sinus problems?: No  Hematologic/Lymphatic Swollen glands?: No Easy bruising?: No  Cardiovascular Leg swelling?: No Chest pain?: No  Respiratory Cough?: No Shortness of breath?: No  Endocrine Excessive thirst?: No  Musculoskeletal Back pain?: No Joint pain?: No  Neurological Headaches?: No Dizziness?: No  Psychologic Depression?: No Anxiety?: No  Physical Exam: BP 119/66   Pulse 77   Ht 5\' 5"  (1.651 m)   Wt 175 lb (79.4 kg)   BMI 29.12 kg/m   GU: Patient with circumcised phallus.   Urethral meatus is patent.  No penile discharge. No penile lesions or rashes. Scrotum without lesions, cysts, rashes and/or edema.  Testicles are located scrotally bilaterally. No masses are appreciated in the testicles. Left and right epididymis are normal. Rectal: Patient with  normal sphincter tone. Perineum without scarring or rashes. No rectal masses are appreciated. Prostate is approximately 50 grams, no nodules are appreciated. Seminal vesicles are normal.   Laboratory Data: Lab Results  Component Value Date   WBC 6.6 07/28/2015   HGB 12.7 (L) 07/28/2015   HCT 38.2 (L) 07/28/2015   MCV 95.2 07/28/2015   PLT 248 07/28/2015    Lab Results  Component Value Date   CREATININE 1.15 07/28/2015   PSA History:  2.0 ng/mL on 05/20/2014  1.0 ng/mL on 12/02/2014  0.4 ng/mL on 07/09/2015  0.3 ng/mL on 12/04/2015  0.2 ng/mL on  04/28/2016   Lab Results  Component Value Date   HGBA1C 6.3 04/24/2014    Persistent imaging Results for orders placed or performed in visit on 05/04/16  BLADDER SCAN AMB NON-IMAGING  Result Value Ref Range   Scan Result 78      Assessment & Plan:    1.  BPH with LUTS:   IPSS score 5/1.  It is improving.     Patient is currently on tamsulosin and finasteride. He will continue those medications.  He will return to the office in 12 months time for IPSS score, exam and PSA.  2. Nocturia  - nocturia  1.  He has implemented some behavioral and dietary changes.  We will continue to monitor.  He will RTC in 12 months for a PSA, exam and IPSS score.    3. Erectile dysfunction:  He would like to table this discussion until he finds a willing partner.   Return in about 1 year (around 05/17/2018) for IPSS, PSA and exam.  Michiel CowboySHANNON Keelin Neville, [facility]  [facility] [address], [address] [place], [place] [address] [phone]

## 2017-05-17 ENCOUNTER — Ambulatory Visit (INDEPENDENT_AMBULATORY_CARE_PROVIDER_SITE_OTHER): Payer: Medicare HMO | Admitting: Urology

## 2017-05-17 ENCOUNTER — Encounter: Payer: Self-pay | Admitting: Urology

## 2017-05-17 VITALS — BP 119/66 | HR 77 | Ht 65.0 in | Wt 175.0 lb

## 2017-05-17 DIAGNOSIS — N529 Male erectile dysfunction, unspecified: Secondary | ICD-10-CM | POA: Diagnosis not present

## 2017-05-17 DIAGNOSIS — R351 Nocturia: Secondary | ICD-10-CM

## 2017-05-17 DIAGNOSIS — N401 Enlarged prostate with lower urinary tract symptoms: Secondary | ICD-10-CM

## 2017-05-17 DIAGNOSIS — N138 Other obstructive and reflux uropathy: Secondary | ICD-10-CM

## 2017-05-18 ENCOUNTER — Telehealth: Payer: Self-pay

## 2017-05-18 LAB — PSA: PROSTATE SPECIFIC AG, SERUM: 0.3 ng/mL (ref 0.0–4.0)

## 2017-05-18 NOTE — Telephone Encounter (Signed)
Spoke with pt in reference to PSA results and f/u. Pt voiced understanding.  

## 2017-05-18 NOTE — Telephone Encounter (Signed)
-----   Message from Harle BattiestShannon A McGowan, PA-C sent at 05/18/2017  7:42 AM EDT ----- Patient's PSA is stable at 0.3.   We will see him in 12 months.  PSA to be drawn before his next appointment.

## 2017-06-02 DIAGNOSIS — J019 Acute sinusitis, unspecified: Secondary | ICD-10-CM | POA: Diagnosis not present

## 2017-06-21 DIAGNOSIS — E7800 Pure hypercholesterolemia, unspecified: Secondary | ICD-10-CM | POA: Diagnosis not present

## 2017-06-21 DIAGNOSIS — I10 Essential (primary) hypertension: Secondary | ICD-10-CM | POA: Diagnosis not present

## 2017-06-21 DIAGNOSIS — Z95810 Presence of automatic (implantable) cardiac defibrillator: Secondary | ICD-10-CM | POA: Diagnosis not present

## 2017-06-21 DIAGNOSIS — I420 Dilated cardiomyopathy: Secondary | ICD-10-CM | POA: Diagnosis not present

## 2017-06-21 DIAGNOSIS — I4901 Ventricular fibrillation: Secondary | ICD-10-CM | POA: Diagnosis not present

## 2017-06-21 DIAGNOSIS — I5022 Chronic systolic (congestive) heart failure: Secondary | ICD-10-CM | POA: Diagnosis not present

## 2017-06-28 DIAGNOSIS — I4901 Ventricular fibrillation: Secondary | ICD-10-CM | POA: Diagnosis not present

## 2017-08-23 ENCOUNTER — Other Ambulatory Visit: Payer: Self-pay | Admitting: Urology

## 2017-08-23 DIAGNOSIS — N401 Enlarged prostate with lower urinary tract symptoms: Secondary | ICD-10-CM

## 2017-09-27 DIAGNOSIS — I471 Supraventricular tachycardia: Secondary | ICD-10-CM | POA: Diagnosis not present

## 2017-10-11 DIAGNOSIS — E7800 Pure hypercholesterolemia, unspecified: Secondary | ICD-10-CM | POA: Diagnosis not present

## 2017-10-11 DIAGNOSIS — I420 Dilated cardiomyopathy: Secondary | ICD-10-CM | POA: Diagnosis not present

## 2017-10-11 DIAGNOSIS — Z95810 Presence of automatic (implantable) cardiac defibrillator: Secondary | ICD-10-CM | POA: Diagnosis not present

## 2017-10-11 DIAGNOSIS — F418 Other specified anxiety disorders: Secondary | ICD-10-CM | POA: Diagnosis not present

## 2017-10-11 DIAGNOSIS — I4891 Unspecified atrial fibrillation: Secondary | ICD-10-CM | POA: Diagnosis not present

## 2017-10-11 DIAGNOSIS — I4901 Ventricular fibrillation: Secondary | ICD-10-CM | POA: Diagnosis not present

## 2017-10-11 DIAGNOSIS — I10 Essential (primary) hypertension: Secondary | ICD-10-CM | POA: Diagnosis not present

## 2017-10-11 DIAGNOSIS — I5022 Chronic systolic (congestive) heart failure: Secondary | ICD-10-CM | POA: Diagnosis not present

## 2017-10-14 DIAGNOSIS — N401 Enlarged prostate with lower urinary tract symptoms: Secondary | ICD-10-CM | POA: Diagnosis not present

## 2017-10-14 DIAGNOSIS — R3912 Poor urinary stream: Secondary | ICD-10-CM | POA: Diagnosis not present

## 2017-10-14 DIAGNOSIS — Z0000 Encounter for general adult medical examination without abnormal findings: Secondary | ICD-10-CM | POA: Diagnosis not present

## 2017-10-14 DIAGNOSIS — I420 Dilated cardiomyopathy: Secondary | ICD-10-CM | POA: Diagnosis not present

## 2017-10-14 DIAGNOSIS — F418 Other specified anxiety disorders: Secondary | ICD-10-CM | POA: Diagnosis not present

## 2017-10-14 DIAGNOSIS — I10 Essential (primary) hypertension: Secondary | ICD-10-CM | POA: Diagnosis not present

## 2017-10-14 DIAGNOSIS — E7800 Pure hypercholesterolemia, unspecified: Secondary | ICD-10-CM | POA: Diagnosis not present

## 2017-10-26 DIAGNOSIS — M25562 Pain in left knee: Secondary | ICD-10-CM | POA: Insufficient documentation

## 2017-10-27 DIAGNOSIS — Z79899 Other long term (current) drug therapy: Secondary | ICD-10-CM | POA: Diagnosis not present

## 2017-12-27 DIAGNOSIS — I420 Dilated cardiomyopathy: Secondary | ICD-10-CM | POA: Diagnosis not present

## 2018-01-11 ENCOUNTER — Other Ambulatory Visit: Payer: Self-pay | Admitting: Unknown Physician Specialty

## 2018-01-11 DIAGNOSIS — M1712 Unilateral primary osteoarthritis, left knee: Secondary | ICD-10-CM

## 2018-01-18 ENCOUNTER — Ambulatory Visit
Admission: RE | Admit: 2018-01-18 | Discharge: 2018-01-18 | Disposition: A | Discharge status: Home / Self Care | Payer: Medicare HMO | Source: Ambulatory Visit | Attending: Unknown Physician Specialty | Admitting: Unknown Physician Specialty

## 2018-01-18 DIAGNOSIS — K4090 Unilateral inguinal hernia, without obstruction or gangrene, not specified as recurrent: Secondary | ICD-10-CM | POA: Diagnosis not present

## 2018-01-18 DIAGNOSIS — M76899 Other specified enthesopathies of unspecified lower limb, excluding foot: Secondary | ICD-10-CM | POA: Diagnosis not present

## 2018-01-18 DIAGNOSIS — M7732 Calcaneal spur, left foot: Secondary | ICD-10-CM | POA: Diagnosis not present

## 2018-01-18 DIAGNOSIS — M1712 Unilateral primary osteoarthritis, left knee: Secondary | ICD-10-CM | POA: Diagnosis not present

## 2018-01-18 DIAGNOSIS — Z96652 Presence of left artificial knee joint: Secondary | ICD-10-CM | POA: Diagnosis not present

## 2018-01-18 DIAGNOSIS — Z471 Aftercare following joint replacement surgery: Secondary | ICD-10-CM | POA: Diagnosis not present

## 2018-01-25 ENCOUNTER — Ambulatory Visit: Payer: Commercial Managed Care - HMO

## 2018-02-17 DIAGNOSIS — M1712 Unilateral primary osteoarthritis, left knee: Secondary | ICD-10-CM | POA: Diagnosis not present

## 2018-03-14 DIAGNOSIS — M1712 Unilateral primary osteoarthritis, left knee: Secondary | ICD-10-CM | POA: Diagnosis not present

## 2018-04-09 ENCOUNTER — Other Ambulatory Visit: Payer: Self-pay | Admitting: Urology

## 2018-04-09 DIAGNOSIS — N401 Enlarged prostate with lower urinary tract symptoms: Principal | ICD-10-CM

## 2018-04-09 DIAGNOSIS — N138 Other obstructive and reflux uropathy: Secondary | ICD-10-CM

## 2018-04-17 DIAGNOSIS — R3912 Poor urinary stream: Secondary | ICD-10-CM | POA: Diagnosis not present

## 2018-04-17 DIAGNOSIS — J0100 Acute maxillary sinusitis, unspecified: Secondary | ICD-10-CM | POA: Diagnosis not present

## 2018-04-17 DIAGNOSIS — Z125 Encounter for screening for malignant neoplasm of prostate: Secondary | ICD-10-CM | POA: Diagnosis not present

## 2018-04-17 DIAGNOSIS — I420 Dilated cardiomyopathy: Secondary | ICD-10-CM | POA: Diagnosis not present

## 2018-04-17 DIAGNOSIS — N401 Enlarged prostate with lower urinary tract symptoms: Secondary | ICD-10-CM | POA: Diagnosis not present

## 2018-04-17 DIAGNOSIS — I5020 Unspecified systolic (congestive) heart failure: Secondary | ICD-10-CM | POA: Diagnosis not present

## 2018-04-17 DIAGNOSIS — F418 Other specified anxiety disorders: Secondary | ICD-10-CM | POA: Diagnosis not present

## 2018-04-17 DIAGNOSIS — I10 Essential (primary) hypertension: Secondary | ICD-10-CM | POA: Diagnosis not present

## 2018-04-17 DIAGNOSIS — E7800 Pure hypercholesterolemia, unspecified: Secondary | ICD-10-CM | POA: Diagnosis not present

## 2018-04-18 DIAGNOSIS — I2510 Atherosclerotic heart disease of native coronary artery without angina pectoris: Secondary | ICD-10-CM | POA: Diagnosis not present

## 2018-04-18 DIAGNOSIS — I4901 Ventricular fibrillation: Secondary | ICD-10-CM | POA: Diagnosis not present

## 2018-04-18 DIAGNOSIS — Z95810 Presence of automatic (implantable) cardiac defibrillator: Secondary | ICD-10-CM | POA: Diagnosis not present

## 2018-04-18 DIAGNOSIS — I5022 Chronic systolic (congestive) heart failure: Secondary | ICD-10-CM | POA: Diagnosis not present

## 2018-04-18 DIAGNOSIS — E7800 Pure hypercholesterolemia, unspecified: Secondary | ICD-10-CM | POA: Diagnosis not present

## 2018-04-18 DIAGNOSIS — I420 Dilated cardiomyopathy: Secondary | ICD-10-CM | POA: Diagnosis not present

## 2018-04-18 DIAGNOSIS — I10 Essential (primary) hypertension: Secondary | ICD-10-CM | POA: Diagnosis not present

## 2018-05-10 ENCOUNTER — Other Ambulatory Visit: Payer: Self-pay | Admitting: Urology

## 2018-05-10 DIAGNOSIS — N401 Enlarged prostate with lower urinary tract symptoms: Secondary | ICD-10-CM

## 2018-05-18 ENCOUNTER — Ambulatory Visit: Payer: Medicare HMO | Admitting: Urology

## 2018-05-25 DIAGNOSIS — I420 Dilated cardiomyopathy: Secondary | ICD-10-CM | POA: Diagnosis not present

## 2018-06-13 NOTE — Progress Notes (Signed)
9:44 AM   [name] 1946-02-21 [id number]  Referring provider: Marina Goodell, MD [address] [place], [place] [address]  Chief Complaint  Patient presents with  . Benign Prostatic Hypertrophy    HPI: Mr. Pherigo is a 72 year old WM with nocturia, ED and BPH with LUTS who presents today for a 12 month follow-up.  BPH WITH LUTS His IPSS score today is 16, which is moderate lower urinary tract symptomatology.  He is mostly satisfied with his quality life due to his urinary symptoms.  His previous IPSS was 5/1.  His previous PVR is 78 mL.   His major complaint today intermittency, weak stream and nocturia x 1.  He has had these symptoms for several years.  He denies any dysuria, hematuria or suprapubic pain.   He currently taking tamsulosin 0.4 mg daily and finasteride 5 mg daily.  His has had urethral dilations in the past.  He also denies any recent fevers, chills, nausea or vomiting.   He does not have a family history of PCa.  IPSS    Row Name 06/14/18 0900         International Prostate Symptom Score   How often have you had the sensation of not emptying your bladder?  About half the time     How often have you had to urinate less than every two hours?  Less than half the time     How often have you found you stopped and started again several times when you urinated?  About half the time     How often have you found it difficult to postpone urination?  Less than 1 in 5 times     How often have you had a weak urinary stream?  About half the time     How often have you had to strain to start urination?  Less than half the time     How many times did you typically get up at night to urinate?  2 Times     Total IPSS Score  16       Quality of Life due to urinary symptoms   If you were to spend the rest of your life with your urinary condition just the way it is now how would you feel about that?  Mostly Satisfied        Score:  1-7 Mild 8-19 Moderate 20-35  Severe  Nocturia Patient is getting up once to twice nightly.      PMH: Past Medical History:  Diagnosis Date  . Acute prostatitis   . Balanitis   . Benign enlargement of prostate   . Cardiomyopathy, dilated (HCC)   . Frequency   . HLD (hyperlipidemia)   . HTN (hypertension)   . Hyperlipidemia   . Incomplete bladder emptying   . Nocturia   . Paroxysmal ventricular fibrillation (HCC)   . Phimosis   . Single vessel coronary artery disease   . Systolic CHF [facility])     Surgical History: Past Surgical History:  Procedure Laterality Date  . defribrillator     Implant  . REPLACEMENT TOTAL KNEE    . SHOULDER ARTHROSCOPY WITH OPEN ROTATOR CUFF REPAIR Right 08/06/2015   Procedure: SHOULDER ARTHROSCOPY WITH MINI OPEN ROTATOR CUFF REPAIR, SUBACROMIAL DECOPRESSION, RELEASE LONGHEAD BICEP TENDON ;  Surgeon: Erin Sons, MD;  Location: [facility] ORS;  Service: Orthopedics;  Laterality: Right;    Home Medications:  Allergies as of 06/14/2018   No  Known Allergies     Medication List        Accurate as of 06/14/18  9:44 AM. Always use your most recent med list.          amiodarone 200 MG tablet Commonly known as:  PACERONE Take 200 mg by mouth daily.   aspirin EC 81 MG tablet Take 81 mg by mouth daily. Reported on 01/06/2016   atorvastatin 40 MG tablet Commonly known as:  LIPITOR Take 40 mg by mouth daily.   betamethasone dipropionate 0.05 % cream Commonly known as:  DIPROLENE Apply topically 2 (two) times daily.   carvedilol 12.5 MG tablet Commonly known as:  COREG Take 12.5 mg by mouth 2 (two) times daily with a meal.   celecoxib 100 MG capsule Commonly known as:  CELEBREX TAKE 1 CAPSULE (100 MG TOTAL) BY MOUTH EVERY OTHER DAY   clopidogrel 75 MG tablet Commonly known as:  PLAVIX Take 75 mg by mouth daily. Reported on 01/06/2016   enalapril 2.5 MG tablet Commonly known as:  VASOTEC Take 2.5 mg by mouth daily.   finasteride 5 MG tablet Commonly known as:   PROSCAR TAKE 1 TABLET BY MOUTH DAILY   fluticasone 50 MCG/ACT nasal spray Commonly known as:  FLONASE Place into both nostrils daily.   furosemide 40 MG tablet Commonly known as:  LASIX Take 40 mg by mouth daily.   KLOR-CON M20 20 MEQ tablet Generic drug:  potassium chloride SA Reported on 05/04/2016   sertraline 25 MG tablet Commonly known as:  ZOLOFT Take 25 mg by mouth daily.   spironolactone 12.5 mg Tabs tablet Commonly known as:  ALDACTONE Take 12.5 mg by mouth daily.   tamsulosin 0.4 MG Caps capsule Commonly known as:  FLOMAX TAKE 1 CAPSULE EVERY DAY   VENTOLIN HFA 108 (90 Base) MCG/ACT inhaler Generic drug:  albuterol Inhale into the lungs.       Allergies: No Known Allergies  Family History: Family History  Problem Relation Age of Onset  . Kidney disease Mother   . Bladder Cancer Mother   . Prostate cancer Neg Hx   . Kidney cancer Neg Hx     Social History:  reports that he quit smoking about 11 years ago. He has never used smokeless tobacco. He reports that he does not drink alcohol or use drugs.  ROS: UROLOGY Frequent Urination?: No Hard to postpone urination?: No Burning/pain with urination?: No Get up at night to urinate?: Yes Leakage of urine?: No Urine stream starts and stops?: No Trouble starting stream?: No Do you have to strain to urinate?: No Blood in urine?: No Urinary tract infection?: No Sexually transmitted disease?: No Injury to kidneys or bladder?: No Painful intercourse?: No Weak stream?: No Erection problems?: No Penile pain?: No  Gastrointestinal Nausea?: No Vomiting?: No Indigestion/heartburn?: No Diarrhea?: No Constipation?: No  Constitutional Fever: No Night sweats?: No Weight loss?: No Fatigue?: No  Skin Skin rash/lesions?: No Itching?: No  Eyes Blurred vision?: No Double vision?: No  Ears/Nose/Throat Sore throat?: No Sinus problems?: No  Hematologic/Lymphatic Swollen glands?: No Easy bruising?:  No  Cardiovascular Leg swelling?: No Chest pain?: No  Respiratory Cough?: No Shortness of breath?: No  Endocrine Excessive thirst?: No  Musculoskeletal Back pain?: No Joint pain?: No  Neurological Headaches?: No Dizziness?: No  Psychologic Depression?: No Anxiety?: No  Physical Exam: BP 106/67 (BP Location: Right Arm, Patient Position: Sitting, Cuff Size: Normal)   Pulse 81   Ht 5\' 5"  (1.651 m)  Wt 165 lb (74.8 kg)   BMI 27.46 kg/m   Constitutional: Well nourished. Alert and oriented, No acute distress. HEENT: [place] AT, moist mucus membranes. Trachea midline, no masses. Cardiovascular: No clubbing, cyanosis, or edema. Respiratory: Normal respiratory effort, no increased work of breathing. GI: Abdomen is soft, non tender, non distended, no abdominal masses. Liver and spleen not palpable.  No hernias appreciated.  Stool sample for occult testing is not indicated.   GU: No CVA tenderness.  No bladder fullness or masses.  Patient with circumcised phallus.   Urethral meatus is patent.  No penile discharge. No penile lesions or rashes. Scrotum without lesions, cysts, rashes and/or edema.  Testicles are located scrotally bilaterally. No masses are appreciated in the testicles. Left and right epididymis are normal. Rectal: Patient with  normal sphincter tone. Anus and perineum without scarring or rashes. No rectal masses are appreciated. Prostate is approximately 50 grams, no nodules are appreciated. Seminal vesicles are normal. Skin: No rashes, bruises or suspicious lesions. Lymph: No cervical or inguinal adenopathy. Neurologic: Grossly intact, no focal deficits, moving all 4 extremities. Psychiatric: Normal mood and affect.    Laboratory Data: Lab Results  Component Value Date   WBC 6.6 07/28/2015   HGB 12.7 (L) 07/28/2015   HCT 38.2 (L) 07/28/2015   MCV 95.2 07/28/2015   PLT 248 07/28/2015    Lab Results  Component Value Date   CREATININE 1.15 07/28/2015   PSA  History:  2.0 (4.0) ng/mL on 05/20/2014  1.0 ng/mL (2.0) on 12/02/2014  0.4 ng/mL (0.8) on 07/09/2015  0.3 ng/mL (0.6) on 12/04/2015  0.2 ng/mL (0.4) on 04/28/2016  0.3 ng/mL (0.6) in 04/2017   Lab Results  Component Value Date   HGBA1C 6.3 04/24/2014   I have reviewed the labs.     Assessment & Plan:    1.  BPH with LUTS:   IPSS score 16/2.  It is worsening.  Patient is currently on tamsulosin and finasteride. He will continue those medications.  He will return to the office in 12 months time for IPSS score, exam and PSA.  2. Nocturia  - nocturia  1 -2.  He has implemented some behavioral and dietary changes.  We will continue to monitor.  He will RTC in 12 months for a PSA, exam and IPSS score.    3. Erectile dysfunction:  He would like to table this discussion until he finds a willing partner.   Return in about 1 year (around 06/15/2019) for IPSS, PSA and exam.  Michiel CowboySHANNON Jemari Hallum, [facility]erman[facility] HospitalA-C  [facility] [address] [place], [place] [address] [phone]

## 2018-06-14 ENCOUNTER — Encounter: Payer: Self-pay | Admitting: Urology

## 2018-06-14 ENCOUNTER — Ambulatory Visit: Payer: Medicare HMO | Admitting: Urology

## 2018-06-14 VITALS — BP 106/67 | HR 81 | Ht 65.0 in | Wt 165.0 lb

## 2018-06-14 DIAGNOSIS — N401 Enlarged prostate with lower urinary tract symptoms: Secondary | ICD-10-CM | POA: Diagnosis not present

## 2018-06-14 DIAGNOSIS — N138 Other obstructive and reflux uropathy: Secondary | ICD-10-CM

## 2018-06-14 DIAGNOSIS — N529 Male erectile dysfunction, unspecified: Secondary | ICD-10-CM

## 2018-06-15 ENCOUNTER — Telehealth: Payer: Self-pay

## 2018-06-15 LAB — PSA: Prostate Specific Ag, Serum: 0.2 ng/mL (ref 0.0–4.0)

## 2018-06-15 NOTE — Telephone Encounter (Signed)
Patient notified

## 2018-06-15 NOTE — Telephone Encounter (Signed)
-----   Message from Harle BattiestShannon A McGowan, PA-C sent at 06/15/2018  7:34 AM EDT ----- Please let [name] know that his PSA was stable at 0.2.

## 2018-08-29 DIAGNOSIS — I4901 Ventricular fibrillation: Secondary | ICD-10-CM | POA: Diagnosis not present

## 2018-10-17 DIAGNOSIS — F3341 Major depressive disorder, recurrent, in partial remission: Secondary | ICD-10-CM | POA: Diagnosis not present

## 2018-10-17 DIAGNOSIS — I10 Essential (primary) hypertension: Secondary | ICD-10-CM | POA: Diagnosis not present

## 2018-10-17 DIAGNOSIS — Z0000 Encounter for general adult medical examination without abnormal findings: Secondary | ICD-10-CM | POA: Diagnosis not present

## 2018-10-17 DIAGNOSIS — N401 Enlarged prostate with lower urinary tract symptoms: Secondary | ICD-10-CM | POA: Diagnosis not present

## 2018-10-17 DIAGNOSIS — F419 Anxiety disorder, unspecified: Secondary | ICD-10-CM | POA: Diagnosis not present

## 2018-10-17 DIAGNOSIS — E7800 Pure hypercholesterolemia, unspecified: Secondary | ICD-10-CM | POA: Diagnosis not present

## 2018-10-17 DIAGNOSIS — I420 Dilated cardiomyopathy: Secondary | ICD-10-CM | POA: Diagnosis not present

## 2018-10-17 DIAGNOSIS — R3912 Poor urinary stream: Secondary | ICD-10-CM | POA: Diagnosis not present

## 2018-10-23 DIAGNOSIS — I420 Dilated cardiomyopathy: Secondary | ICD-10-CM | POA: Diagnosis not present

## 2018-10-23 DIAGNOSIS — I10 Essential (primary) hypertension: Secondary | ICD-10-CM | POA: Diagnosis not present

## 2018-10-23 DIAGNOSIS — I2510 Atherosclerotic heart disease of native coronary artery without angina pectoris: Secondary | ICD-10-CM | POA: Diagnosis not present

## 2018-10-23 DIAGNOSIS — E7800 Pure hypercholesterolemia, unspecified: Secondary | ICD-10-CM | POA: Diagnosis not present

## 2018-10-23 DIAGNOSIS — I5022 Chronic systolic (congestive) heart failure: Secondary | ICD-10-CM | POA: Diagnosis not present

## 2018-10-23 DIAGNOSIS — I4901 Ventricular fibrillation: Secondary | ICD-10-CM | POA: Diagnosis not present

## 2018-11-28 DIAGNOSIS — I4901 Ventricular fibrillation: Secondary | ICD-10-CM | POA: Diagnosis not present

## 2018-12-21 DIAGNOSIS — J40 Bronchitis, not specified as acute or chronic: Secondary | ICD-10-CM | POA: Diagnosis not present

## 2019-02-04 ENCOUNTER — Other Ambulatory Visit: Payer: Self-pay | Admitting: Urology

## 2019-02-04 DIAGNOSIS — N401 Enlarged prostate with lower urinary tract symptoms: Secondary | ICD-10-CM

## 2019-03-24 ENCOUNTER — Other Ambulatory Visit: Payer: Self-pay | Admitting: Urology

## 2019-03-24 DIAGNOSIS — N401 Enlarged prostate with lower urinary tract symptoms: Principal | ICD-10-CM

## 2019-03-24 DIAGNOSIS — N138 Other obstructive and reflux uropathy: Secondary | ICD-10-CM

## 2019-04-02 DIAGNOSIS — R42 Dizziness and giddiness: Secondary | ICD-10-CM | POA: Diagnosis not present

## 2019-04-02 DIAGNOSIS — R0600 Dyspnea, unspecified: Secondary | ICD-10-CM | POA: Diagnosis not present

## 2019-04-04 DIAGNOSIS — R0600 Dyspnea, unspecified: Secondary | ICD-10-CM | POA: Diagnosis not present

## 2019-04-09 DIAGNOSIS — D62 Acute posthemorrhagic anemia: Secondary | ICD-10-CM | POA: Diagnosis not present

## 2019-04-18 DIAGNOSIS — R7301 Impaired fasting glucose: Secondary | ICD-10-CM | POA: Diagnosis not present

## 2019-04-18 DIAGNOSIS — E7800 Pure hypercholesterolemia, unspecified: Secondary | ICD-10-CM | POA: Diagnosis not present

## 2019-04-18 DIAGNOSIS — I420 Dilated cardiomyopathy: Secondary | ICD-10-CM | POA: Diagnosis not present

## 2019-04-18 DIAGNOSIS — D62 Acute posthemorrhagic anemia: Secondary | ICD-10-CM | POA: Diagnosis not present

## 2019-04-18 DIAGNOSIS — N401 Enlarged prostate with lower urinary tract symptoms: Secondary | ICD-10-CM | POA: Diagnosis not present

## 2019-04-18 DIAGNOSIS — Z125 Encounter for screening for malignant neoplasm of prostate: Secondary | ICD-10-CM | POA: Diagnosis not present

## 2019-04-18 DIAGNOSIS — F3341 Major depressive disorder, recurrent, in partial remission: Secondary | ICD-10-CM | POA: Diagnosis not present

## 2019-04-18 DIAGNOSIS — I10 Essential (primary) hypertension: Secondary | ICD-10-CM | POA: Diagnosis not present

## 2019-04-18 DIAGNOSIS — F419 Anxiety disorder, unspecified: Secondary | ICD-10-CM | POA: Diagnosis not present

## 2019-04-25 DIAGNOSIS — I4901 Ventricular fibrillation: Secondary | ICD-10-CM | POA: Diagnosis not present

## 2019-04-25 DIAGNOSIS — E7800 Pure hypercholesterolemia, unspecified: Secondary | ICD-10-CM | POA: Diagnosis not present

## 2019-04-25 DIAGNOSIS — I420 Dilated cardiomyopathy: Secondary | ICD-10-CM | POA: Diagnosis not present

## 2019-04-25 DIAGNOSIS — I10 Essential (primary) hypertension: Secondary | ICD-10-CM | POA: Diagnosis not present

## 2019-04-25 DIAGNOSIS — I5022 Chronic systolic (congestive) heart failure: Secondary | ICD-10-CM | POA: Diagnosis not present

## 2019-04-25 DIAGNOSIS — Z95810 Presence of automatic (implantable) cardiac defibrillator: Secondary | ICD-10-CM | POA: Diagnosis not present

## 2019-04-25 DIAGNOSIS — I2510 Atherosclerotic heart disease of native coronary artery without angina pectoris: Secondary | ICD-10-CM | POA: Diagnosis not present

## 2019-05-01 DIAGNOSIS — I5022 Chronic systolic (congestive) heart failure: Secondary | ICD-10-CM | POA: Diagnosis not present

## 2019-05-29 ENCOUNTER — Telehealth: Payer: Self-pay | Admitting: Urology

## 2019-05-29 NOTE — Progress Notes (Addendum)
11:09 AM   [name] 18-Jan-1946 [id number]  Referring provider: Marina GoodellFeldpausch, Dale E, MD [address] [place], [place] [id number]  Chief Complaint  Patient presents with  . Nocturia  . Benign Prostatic Hypertrophy    HPI: [name] is a 73 year old WM with nocturia, ED and BPH with LUTS who presents today worsening of LU TS.    BPH WITH LUTS  (prostate and/or bladder) IPSS score: 27/5     PVR: 0 mL    Previous score: 16/2  Previous PVR: 78 mL     Major complaint(s):  Frequency, nocturia, incontinence, intermittency, straining to urinate and a weak urinary stream  X few months.  He has a difficult time emptying his bladder in the morning.  He is having to lean forward and press on his lower abdomen to empty his bladder.    He delayed coming in due to the COVID-19 pandemic.  Four days ago, he has started to feel tenderness in the right groin area.  Denies any dysuria, hematuria or suprapubic pain.   He denies diarrhea and constipation.   His UA is negative.    Currently taking: finasteride 5 mg daily and tamsulosin 0.4 mg daily   His has had urethral dilations in the past.     Denies any recent fevers, chills, nausea or vomiting.  He does not have a family history of PCa.  IPSS    Row Name 05/30/19 1000         International Prostate Symptom Score   How often have you had the sensation of not emptying your bladder?  More than half the time     How often have you had to urinate less than every two hours?  More than half the time     How often have you found you stopped and started again several times when you urinated?  More than half the time     How often have you found it difficult to postpone urination?  Less than half the time     How often have you had a weak urinary stream?  Almost always     How often have you had to strain to start urination?  More than half the time     How many times did you typically get up at night to urinate?  4 Times     Total  IPSS Score  27       Quality of Life due to urinary symptoms   If you were to spend the rest of your life with your urinary condition just the way it is now how would you feel about that?  Unhappy        Score:  1-7 Mild 8-19 Moderate 20-35 Severe  PMH: Past Medical History:  Diagnosis Date  . Acute prostatitis   . Balanitis   . Benign enlargement of prostate   . Cardiomyopathy, dilated (HCC)   . Frequency   . HLD (hyperlipidemia)   . HTN (hypertension)   . Hyperlipidemia   . Incomplete bladder emptying   . Nocturia   . Paroxysmal ventricular fibrillation (HCC)   . Phimosis   . Single vessel coronary artery disease   . Systolic CHF [facility](HCC)     Surgical History: Past Surgical History:  Procedure Laterality Date  . defribrillator     Implant  . REPLACEMENT TOTAL KNEE    . SHOULDER ARTHROSCOPY WITH OPEN ROTATOR CUFF REPAIR Right 08/06/2015   Procedure:  SHOULDER ARTHROSCOPY WITH MINI OPEN ROTATOR CUFF REPAIR, SUBACROMIAL DECOPRESSION, RELEASE LONGHEAD BICEP TENDON ;  Surgeon: Leanor Kail, MD;  Location: [facility] ORS;  Service: Orthopedics;  Laterality: Right;    Home Medications:  Allergies as of 05/30/2019   No Known Allergies     Medication List       Accurate as of May 30, 2019 11:09 AM. If you have any questions, ask your nurse or doctor.        STOP taking these medications   carvedilol 12.5 MG tablet Commonly known as:  COREG Stopped by:  Seniah Lawrence, PA-C   clopidogrel 75 MG tablet Commonly known as:  PLAVIX Stopped by:  Zara Council, PA-C     TAKE these medications   amiodarone 200 MG tablet Commonly known as:  PACERONE Take 200 mg by mouth daily.   aspirin EC 81 MG tablet Take 81 mg by mouth daily. Reported on 01/06/2016   atorvastatin 40 MG tablet Commonly known as:  LIPITOR Take 40 mg by mouth daily.   betamethasone dipropionate 0.05 % cream Commonly known as:  DIPROLENE Apply topically 2 (two) times daily.   celecoxib 100 MG  capsule Commonly known as:  CELEBREX TAKE 1 CAPSULE (100 MG TOTAL) BY MOUTH EVERY OTHER DAY   enalapril 2.5 MG tablet Commonly known as:  VASOTEC Take 2.5 mg by mouth daily.   finasteride 5 MG tablet Commonly known as:  PROSCAR TAKE 1 TABLET BY MOUTH DAILY   fluticasone 50 MCG/ACT nasal spray Commonly known as:  FLONASE Place into both nostrils daily.   furosemide 40 MG tablet Commonly known as:  LASIX Take 40 mg by mouth daily.   Klor-Con M20 20 MEQ tablet Generic drug:  potassium chloride SA Reported on 05/04/2016   sertraline 50 MG tablet Commonly known as:  ZOLOFT TAKE 1.5 TABLETS (75 MG TOTAL) BY MOUTH ONCE DAILY What changed:  Another medication with the same name was removed. Continue taking this medication, and follow the directions you see here. Changed by:  Zara Council, PA-C   spironolactone 12.5 mg Tabs tablet Commonly known as:  ALDACTONE Take 12.5 mg by mouth daily.   tamsulosin 0.4 MG Caps capsule Commonly known as:  FLOMAX TAKE 1 CAPSULE EVERY DAY   Ventolin HFA 108 (90 Base) MCG/ACT inhaler Generic drug:  albuterol Inhale into the lungs.       Allergies: No Known Allergies  Family History: Family History  Problem Relation Age of Onset  . Kidney disease Mother   . Bladder Cancer Mother   . Prostate cancer Neg Hx   . Kidney cancer Neg Hx     Social History:  reports that he quit smoking about 12 years ago. He has never used smokeless tobacco. He reports that he does not drink alcohol or use drugs.  ROS: UROLOGY Frequent Urination?: Yes Hard to postpone urination?: No Burning/pain with urination?: No Get up at night to urinate?: Yes Leakage of urine?: Yes Urine stream starts and stops?: No Trouble starting stream?: Yes Do you have to strain to urinate?: Yes Blood in urine?: No Urinary tract infection?: No Sexually transmitted disease?: No Injury to kidneys or bladder?: No Painful intercourse?: No Weak stream?: Yes Erection  problems?: No Penile pain?: No  Gastrointestinal Nausea?: No Vomiting?: No Indigestion/heartburn?: No Diarrhea?: No Constipation?: No  Constitutional Fever: No Night sweats?: No Weight loss?: No Fatigue?: No  Skin Skin rash/lesions?: No Itching?: No  Eyes Blurred vision?: No Double vision?: No  Ears/Nose/Throat Sore throat?:  No Sinus problems?: No  Hematologic/Lymphatic Swollen glands?: No Easy bruising?: No  Cardiovascular Leg swelling?: No Chest pain?: No  Respiratory Cough?: No Shortness of breath?: No  Endocrine Excessive thirst?: No  Musculoskeletal Back pain?: No Joint pain?: No  Neurological Headaches?: No Dizziness?: No  Psychologic Depression?: No Anxiety?: No  Physical Exam: BP 109/63 (BP Location: Left Arm, Patient Position: Sitting, Cuff Size: Normal)   Pulse 74   Ht 5\' 4"  (1.626 m)   Wt 164 lb 4.8 oz (74.5 kg)   BMI 28.20 kg/m   Constitutional:  Well nourished. Alert and oriented, No acute distress. HEENT: [place] AT, moist mucus membranes.  Trachea midline, no masses. Cardiovascular: No clubbing, cyanosis, or edema. Respiratory: Normal respiratory effort, no increased work of breathing. GI: Abdomen is soft, non tender, non distended, no abdominal masses. Liver and spleen not palpable.  No hernias appreciated.  Stool sample for occult testing is not indicated.   GU: No CVA tenderness.  No bladder fullness or masses.  Patient with circumcised phallus.  Urethral meatus is patent.  No penile discharge. No penile lesions or rashes. Scrotum without lesions, cysts, rashes and/or edema.  Testicles are located scrotally bilaterally. No masses are appreciated in the testicles. Left and right epididymis are normal. Rectal: Patient with  normal sphincter tone. Anus and perineum without scarring or rashes. No rectal masses are appreciated. Prostate is approximately 45 grams, no nodules are appreciated.  Skin: No rashes, bruises or suspicious lesions.  Lymph: No cervical or inguinal adenopathy. Neurologic: Grossly intact, no focal deficits, moving all 4 extremities. Psychiatric: Normal mood and affect.  Laboratory Data: Urinalysis Negative.  See Epic.  Lab Results  Component Value Date   WBC 6.6 07/28/2015   HGB 12.7 (House) 07/28/2015   HCT 38.2 (House) 07/28/2015   MCV 95.2 07/28/2015   PLT 248 07/28/2015    Lab Results  Component Value Date   CREATININE 1.15 07/28/2015   PSA History:  2.0 (4.0) ng/mL on 05/20/2014  1.0 ng/mL (2.0) on 12/02/2014  0.4 ng/mL (0.8) on 07/09/2015  0.3 ng/mL (0.6) on 12/04/2015  0.2 ng/mL (0.4) on 04/28/2016  0.3 ng/mL (0.6) in 04/2017  0.2 ng/mL (0.4) in 05/2018  Lab Results  Component Value Date   HGBA1C 6.3 04/24/2014   I have reviewed the labs.  Results for [name], [name] (MRN [id number]) as of 05/30/2019 10:46  Ref. Range 05/30/2019 10:42  Scan Result Unknown 0mL      Assessment & Plan:    1. History of urethral strictures Patient has had to be dilated in the past and is now having symptoms reminiscent of previous strictures  He will return later for a cystoscopy to rule out strictures  I have explained to the patient that they will  be scheduled for a cystoscopy in our office to evaluate their bladder.  The cystoscopy consists of passing a tube with a lens up through their urethra and into their urinary bladder.   We will inject the urethra with a lidocaine gel prior to introducing the cystoscope to help with any discomfort during the procedure.   After the procedure, they might experience blood in the urine and discomfort with urination.  This will abate after the first few voids.  I have  encouraged the patient to increase water intake  during this time.  Patient denies any allergies to lidocaine.   2.  BPH with LUTS:   IPSS score 27/5.  It is worsening and may be due to stricture  disease.  Patient is currently on tamsulosin and finasteride. He will continue those medications.   PSA  not drawn today as he was having severe LU TS - will have it drawn once his symptoms have abated  Return for cystoscopy to rule out stricture .  Michiel CowboySHANNON Khasir Woodrome, PA-C  [facility] [address] [place], [place] [address] [phone]

## 2019-05-29 NOTE — Telephone Encounter (Signed)
Please schedule appointment with Larene Beach

## 2019-05-29 NOTE — Telephone Encounter (Signed)
Pt called and states he is having frequent urination and difficulty urinating, He would like to see [facility] ASAP. He denies fever nausea and vomiting.

## 2019-05-30 ENCOUNTER — Other Ambulatory Visit: Payer: Self-pay

## 2019-05-30 ENCOUNTER — Encounter: Payer: Self-pay | Admitting: Urology

## 2019-05-30 ENCOUNTER — Ambulatory Visit (INDEPENDENT_AMBULATORY_CARE_PROVIDER_SITE_OTHER): Payer: Medicare HMO | Admitting: Urology

## 2019-05-30 VITALS — BP 109/63 | HR 74 | Ht 64.0 in | Wt 164.3 lb

## 2019-05-30 DIAGNOSIS — N401 Enlarged prostate with lower urinary tract symptoms: Secondary | ICD-10-CM

## 2019-05-30 DIAGNOSIS — Z87898 Personal history of other specified conditions: Secondary | ICD-10-CM | POA: Diagnosis not present

## 2019-05-30 DIAGNOSIS — N138 Other obstructive and reflux uropathy: Secondary | ICD-10-CM | POA: Diagnosis not present

## 2019-05-30 DIAGNOSIS — N529 Male erectile dysfunction, unspecified: Secondary | ICD-10-CM | POA: Diagnosis not present

## 2019-05-30 LAB — URINALYSIS, COMPLETE
Bilirubin, UA: NEGATIVE
Glucose, UA: NEGATIVE
Ketones, UA: NEGATIVE
Leukocytes,UA: NEGATIVE
Nitrite, UA: NEGATIVE
Protein,UA: NEGATIVE
RBC, UA: NEGATIVE
Specific Gravity, UA: 1.02 (ref 1.005–1.030)
Urobilinogen, Ur: 0.2 mg/dL (ref 0.2–1.0)
pH, UA: 6 (ref 5.0–7.5)

## 2019-05-30 LAB — BLADDER SCAN AMB NON-IMAGING

## 2019-05-30 LAB — MICROSCOPIC EXAMINATION
Bacteria, UA: NONE SEEN
Epithelial Cells (non renal): NONE SEEN /hpf (ref 0–10)
RBC, Urine: NONE SEEN /hpf (ref 0–2)

## 2019-05-30 NOTE — Patient Instructions (Signed)

## 2019-05-30 NOTE — Progress Notes (Signed)
   05/30/19  CC:  Chief Complaint  Patient presents with  . Nocturia  . Benign Prostatic Hypertrophy    Blood pressure 109/63, pulse 74, height 5\' 4"  (1.626 m), weight 164 lb 4.8 oz (74.5 kg). NED. A&Ox3.   No respiratory distress   Abd soft, NT, ND Normal phallus with bilateral descended testicles  Cystoscopy Procedure Note  Patient identification was confirmed, informed consent was obtained, and patient was prepped using Betadine solution.  Lidocaine jelly was administered per urethral meatus.     Pre-Procedure: - Inspection reveals a normal caliber ureteral meatus.  Procedure: The flexible cystoscope was introduced without difficulty - No urethral strictures/lesions are present. - Normal prostate  - Normal bladder neck - Bilateral ureteral orifices identified - Bladder mucosa  reveals no ulcers, tumors, or lesions - No bladder stones - No trabeculation  Retroflexion unremarkable  Post-Procedure: - Patient tolerated the procedure well   Hollice Espy, MD

## 2019-06-14 ENCOUNTER — Ambulatory Visit: Payer: Medicare HMO | Admitting: Urology

## 2019-06-18 ENCOUNTER — Ambulatory Visit: Payer: Medicare HMO | Admitting: Urology

## 2019-06-20 ENCOUNTER — Ambulatory Visit: Payer: Medicare HMO | Admitting: Urology

## 2019-06-27 NOTE — Progress Notes (Deleted)
1:07 PM   [name] 03-Apr-1946 [id number]  Referring provider: Marina GoodellFeldpausch, Dale E, MD [address] [place],  [place] [id number]  No chief complaint on file.   HPI: [name] is a 73 year old male with nocturia, ED and BPH with LUTS who presents today worsening of LU TS.    BPH WITH LUTS  (prostate and/or bladder) IPSS score: ***     PVR: *** mL    Previous score: 27/5  Previous PVR: 0 mL     Major complaint(s):  Frequency, nocturia, incontinence, intermittency, straining to urinate and a weak urinary stream  X few months.  He has a difficult time emptying his bladder in the morning.  He is having to lean forward and press on his lower abdomen to empty his bladder.    He delayed coming in due to the COVID-19 pandemic.  Four days ago, he has started to feel tenderness in the right groin area.  Denies any dysuria, hematuria or suprapubic pain.   He denies diarrhea and constipation.   His UA is negative.    Currently taking: finasteride 5 mg daily and tamsulosin 0.4 mg daily   His has had urethral dilations in the past.  He underwent a cystoscopy with Dr. Apolinar JunesBrandon on 05/30/2019 and it was NED.     Denies any recent fevers, chills, nausea or vomiting.  He does not have a family history of PCa.    Score:  1-7 Mild 8-19 Moderate 20-35 Severe  PMH: Past Medical History:  Diagnosis Date  . Acute prostatitis   . Balanitis   . Benign enlargement of prostate   . Cardiomyopathy, dilated (HCC)   . Frequency   . HLD (hyperlipidemia)   . HTN (hypertension)   . Hyperlipidemia   . Incomplete bladder emptying   . Nocturia   . Paroxysmal ventricular fibrillation (HCC)   . Phimosis   . Single vessel coronary artery disease   . Systolic CHF [facility](HCC)     Surgical History: Past Surgical History:  Procedure Laterality Date  . defribrillator     Implant  . REPLACEMENT TOTAL KNEE    . SHOULDER ARTHROSCOPY WITH OPEN ROTATOR CUFF REPAIR Right 08/06/2015   Procedure:  SHOULDER ARTHROSCOPY WITH MINI OPEN ROTATOR CUFF REPAIR, SUBACROMIAL DECOPRESSION, RELEASE LONGHEAD BICEP TENDON ;  Surgeon: Erin SonsHarold Kernodle, MD;  Location: [facility] ORS;  Service: Orthopedics;  Laterality: Right;    Home Medications:  Allergies as of 06/28/2019   No Known Allergies     Medication List       Accurate as of June 27, 2019  1:07 PM. If you have any questions, ask your nurse or doctor.        amiodarone 200 MG tablet Commonly known as: PACERONE Take 200 mg by mouth daily.   aspirin EC 81 MG tablet Take 81 mg by mouth daily. Reported on 01/06/2016   atorvastatin 40 MG tablet Commonly known as: LIPITOR Take 40 mg by mouth daily.   betamethasone dipropionate 0.05 % cream Commonly known as: DIPROLENE Apply topically 2 (two) times daily.   celecoxib 100 MG capsule Commonly known as: CELEBREX TAKE 1 CAPSULE (100 MG TOTAL) BY MOUTH EVERY OTHER DAY   enalapril 2.5 MG tablet Commonly known as: VASOTEC Take 2.5 mg by mouth daily.   finasteride 5 MG tablet Commonly known as: PROSCAR TAKE 1 TABLET BY MOUTH DAILY   fluticasone 50 MCG/ACT nasal spray Commonly known as: FLONASE Place into both nostrils  daily.   furosemide 40 MG tablet Commonly known as: LASIX Take 40 mg by mouth daily.   Klor-Con M20 20 MEQ tablet Generic drug: potassium chloride SA Reported on 05/04/2016   sertraline 50 MG tablet Commonly known as: ZOLOFT TAKE 1.5 TABLETS (75 MG TOTAL) BY MOUTH ONCE DAILY   spironolactone 12.5 mg Tabs tablet Commonly known as: ALDACTONE Take 12.5 mg by mouth daily.   tamsulosin 0.4 MG Caps capsule Commonly known as: FLOMAX TAKE 1 CAPSULE EVERY DAY   Ventolin HFA 108 (90 Base) MCG/ACT inhaler Generic drug: albuterol Inhale into the lungs.       Allergies: No Known Allergies  Family History: Family History  Problem Relation Age of Onset  . Kidney disease Mother   . Bladder Cancer Mother   . Prostate cancer Neg Hx   . Kidney cancer Neg Hx      Social History:  reports that he quit smoking about 12 years ago. He has never used smokeless tobacco. He reports that he does not drink alcohol or use drugs.  ROS:                                        Physical Exam: There were no vitals taken for this visit.  Constitutional:  Well nourished. Alert and oriented, No acute distress. HEENT: [place] AT, moist mucus membranes.  Trachea midline, no masses. Cardiovascular: No clubbing, cyanosis, or edema. Respiratory: Normal respiratory effort, no increased work of breathing. GI: Abdomen is soft, non tender, non distended, no abdominal masses. Liver and spleen not palpable.  No hernias appreciated.  Stool sample for occult testing is not indicated.   GU: No CVA tenderness.  No bladder fullness or masses.  Patient with circumcised/uncircumcised phallus. ***Foreskin easily retracted***  Urethral meatus is patent.  No penile discharge. No penile lesions or rashes. Scrotum without lesions, cysts, rashes and/or edema.  Testicles are located scrotally bilaterally. No masses are appreciated in the testicles. Left and right epididymis are normal. Rectal: Patient with  normal sphincter tone. Anus and perineum without scarring or rashes. No rectal masses are appreciated. Prostate is approximately *** grams, *** nodules are appreciated. Seminal vesicles are normal. Skin: No rashes, bruises or suspicious lesions. Lymph: No cervical or inguinal adenopathy. Neurologic: Grossly intact, no focal deficits, moving all 4 extremities. Psychiatric: Normal mood and affect.  Laboratory Data: Urinalysis Negative.  See Epic.  Lab Results  Component Value Date   WBC 6.6 07/28/2015   HGB 12.7 (L) 07/28/2015   HCT 38.2 (L) 07/28/2015   MCV 95.2 07/28/2015   PLT 248 07/28/2015    Lab Results  Component Value Date   CREATININE 1.15 07/28/2015   PSA History:  2.0 (4.0) ng/mL on 05/20/2014  1.0 ng/mL (2.0) on 12/02/2014  0.4 ng/mL (0.8) on  07/09/2015  0.3 ng/mL (0.6) on 12/04/2015  0.2 ng/mL (0.4) on 04/28/2016  0.3 ng/mL (0.6) in 04/2017  0.2 ng/mL (0.4) in 05/2018  Lab Results  Component Value Date   HGBA1C 6.3 04/24/2014   I have reviewed the labs.  Results for Kenneth NieceWHEELEY, Kenneth L (MRN [id number]) as of 05/30/2019 10:46  Ref. Range 05/30/2019 10:42  Scan Result Unknown 0mL      Assessment & Plan:    1. History of urethral strictures No strictures seen on cystoscopy  2.  BPH with LUTS:   IPSS score 27/5.  It is worsening and may  be due to stricture disease.  Patient is currently on tamsulosin and finasteride. He will continue those medications.   PSA not drawn today as he was having severe LU TS - will have it drawn once his symptoms have abated  No follow-ups on file.  Zara Council, PA-C  [facility] [address] [place], [place] [phone]

## 2019-06-28 ENCOUNTER — Ambulatory Visit: Payer: Medicare HMO | Admitting: Urology

## 2019-07-12 NOTE — Progress Notes (Signed)
11:34 AM   Willeen NieceJackie L Oleson 1946/03/25 [id number]  Referring provider: Marina GoodellFeldpausch, Dale E, MD [address] [place],  [place] [phone]  Chief Complaint  Patient presents with  . Benign Prostatic Hypertrophy    HPI: [name] is a 73 year old male with nocturia, ED and BPH with LUTS who presents today for follow up after undergoing a cystoscopy to rule out an urinary stricture.  He presented on June 10 with a complaints of rfequency, nocturia, incontinence, intermittency, straining to urinate and a weak urinary stream for a few months.  He was also having a difficult time emptying his bladder in the morning.  He is having to lean forward and press on his lower abdomen to empty his bladder.    Currently taking: finasteride 5 mg daily and tamsulosin 0.4 mg daily   His has had urethral dilations in the past.  He underwent a cystoscopy with Dr. Apolinar JunesBrandon on 05/30/2019 and it was NED.     Today, he states his symptoms have abated and he feels well.  Patient denies any gross hematuria, dysuria or suprapubic/flank pain.  Patient denies any fevers, chills, nausea or vomiting.    PMH: Past Medical History:  Diagnosis Date  . Acute prostatitis   . Balanitis   . Benign enlargement of prostate   . Cardiomyopathy, dilated (HCC)   . Frequency   . HLD (hyperlipidemia)   . HTN (hypertension)   . Hyperlipidemia   . Incomplete bladder emptying   . Nocturia   . Paroxysmal ventricular fibrillation (HCC)   . Phimosis   . Single vessel coronary artery disease   . Systolic CHF [facility](HCC)     Surgical History: Past Surgical History:  Procedure Laterality Date  . defribrillator     Implant  . REPLACEMENT TOTAL KNEE    . SHOULDER ARTHROSCOPY WITH OPEN ROTATOR CUFF REPAIR Right 08/06/2015   Procedure: SHOULDER ARTHROSCOPY WITH MINI OPEN ROTATOR CUFF REPAIR, SUBACROMIAL DECOPRESSION, RELEASE LONGHEAD BICEP TENDON ;  Surgeon: Erin SonsHarold Kernodle, MD;  Location: [facility] ORS;  Service: Orthopedics;   Laterality: Right;    Home Medications:  Allergies as of 07/13/2019   No Known Allergies     Medication List       Accurate as of July 13, 2019 11:59 PM. If you have any questions, ask your nurse or doctor.        amiodarone 200 MG tablet Commonly known as: PACERONE Take 200 mg by mouth daily.   aspirin EC 81 MG tablet Take 81 mg by mouth daily. Reported on 01/06/2016   atorvastatin 40 MG tablet Commonly known as: LIPITOR Take 40 mg by mouth daily.   betamethasone dipropionate 0.05 % cream Commonly known as: DIPROLENE Apply topically 2 (two) times daily.   celecoxib 100 MG capsule Commonly known as: CELEBREX TAKE 1 CAPSULE (100 MG TOTAL) BY MOUTH EVERY OTHER DAY   enalapril 2.5 MG tablet Commonly known as: VASOTEC Take 2.5 mg by mouth daily.   finasteride 5 MG tablet Commonly known as: PROSCAR TAKE 1 TABLET BY MOUTH DAILY   fluticasone 50 MCG/ACT nasal spray Commonly known as: FLONASE Place into both nostrils daily.   furosemide 40 MG tablet Commonly known as: LASIX Take 40 mg by mouth daily.   Klor-Con M20 20 MEQ tablet Generic drug: potassium chloride SA Reported on 05/04/2016   sertraline 50 MG tablet Commonly known as: ZOLOFT TAKE 1.5 TABLETS (75 MG TOTAL) BY MOUTH ONCE DAILY   spironolactone 12.5  mg Tabs tablet Commonly known as: ALDACTONE Take 12.5 mg by mouth daily.   tamsulosin 0.4 MG Caps capsule Commonly known as: FLOMAX TAKE 1 CAPSULE EVERY DAY   Ventolin HFA 108 (90 Base) MCG/ACT inhaler Generic drug: albuterol Inhale into the lungs.       Allergies: No Known Allergies  Family History: Family History  Problem Relation Age of Onset  . Kidney disease Mother   . Bladder Cancer Mother   . Prostate cancer Neg Hx   . Kidney cancer Neg Hx     Social History:  reports that he quit smoking about 12 years ago. He has never used smokeless tobacco. He reports that he does not drink alcohol or use drugs.  ROS: UROLOGY Frequent  Urination?: No Hard to postpone urination?: No Burning/pain with urination?: No Get up at night to urinate?: No Leakage of urine?: No Urine stream starts and stops?: No Trouble starting stream?: No Do you have to strain to urinate?: No Blood in urine?: No Urinary tract infection?: No Sexually transmitted disease?: No Injury to kidneys or bladder?: No Painful intercourse?: No Weak stream?: No Erection problems?: No Penile pain?: No  Gastrointestinal Nausea?: No Vomiting?: No Indigestion/heartburn?: No Diarrhea?: No Constipation?: No  Constitutional Fever: No Night sweats?: No Weight loss?: No Fatigue?: No  Skin Skin rash/lesions?: No Itching?: No  Eyes Blurred vision?: No Double vision?: No  Ears/Nose/Throat Sore throat?: No Sinus problems?: No  Hematologic/Lymphatic Swollen glands?: No Easy bruising?: No  Cardiovascular Leg swelling?: No Chest pain?: No  Respiratory Cough?: No Shortness of breath?: No  Endocrine Excessive thirst?: No  Musculoskeletal Back pain?: No Joint pain?: No  Neurological Headaches?: No Dizziness?: No  Psychologic Depression?: No Anxiety?: No  Physical Exam: BP 114/67 (BP Location: Left Arm, Patient Position: Sitting, Cuff Size: Normal)   Pulse 81   Ht 5\' 4"  (1.626 m)   Wt 161 lb 1.6 oz (73.1 kg)   BMI 27.65 kg/m   Constitutional:  Well nourished. Alert and oriented, No acute distress. HEENT: [place] AT, moist mucus membranes.  Trachea midline, no masses. Cardiovascular: No clubbing, cyanosis, or edema. Respiratory: Normal respiratory effort, no increased work of breathing. Neurologic: Grossly intact, no focal deficits, moving all 4 extremities. Psychiatric: Normal mood and affect.  Laboratory Data: Lab Results  Component Value Date   WBC 6.6 07/28/2015   HGB 12.7 (L) 07/28/2015   HCT 38.2 (L) 07/28/2015   MCV 95.2 07/28/2015   PLT 248 07/28/2015    Lab Results  Component Value Date   CREATININE 1.15  07/28/2015   PSA History:  2.0 (4.0) ng/mL on 05/20/2014  1.0 ng/mL (2.0) on 12/02/2014  0.4 ng/mL (0.8) on 07/09/2015  0.3 ng/mL (0.6) on 12/04/2015  0.2 ng/mL (0.4) on 04/28/2016  0.3 ng/mL (0.6) in 04/2017  0.2 ng/mL (0.4) in 05/2018  Lab Results  Component Value Date   HGBA1C 6.3 04/24/2014   I have reviewed the labs.  Assessment & Plan:    1. History of urethral strictures No strictures seen on cystoscopy  2.  BPH with LUTS:   Patient is currently on tamsulosin and finasteride. He will continue those medications.   PSA drawn today   Return in about 1 year (around 07/12/2020) for IPSS, PSA and exam.  Zara Council, [facility]  [place], [place] [address] [phone]

## 2019-07-13 ENCOUNTER — Ambulatory Visit (INDEPENDENT_AMBULATORY_CARE_PROVIDER_SITE_OTHER): Payer: Medicare HMO | Admitting: Urology

## 2019-07-13 ENCOUNTER — Other Ambulatory Visit: Payer: Self-pay

## 2019-07-13 ENCOUNTER — Encounter: Payer: Self-pay | Admitting: Urology

## 2019-07-13 VITALS — BP 114/67 | HR 81 | Ht 64.0 in | Wt 161.1 lb

## 2019-07-13 DIAGNOSIS — N138 Other obstructive and reflux uropathy: Secondary | ICD-10-CM | POA: Diagnosis not present

## 2019-07-13 DIAGNOSIS — Z87448 Personal history of other diseases of urinary system: Secondary | ICD-10-CM | POA: Diagnosis not present

## 2019-07-13 DIAGNOSIS — N401 Enlarged prostate with lower urinary tract symptoms: Secondary | ICD-10-CM | POA: Diagnosis not present

## 2019-07-14 LAB — PSA: Prostate Specific Ag, Serum: 0.2 ng/mL (ref 0.0–4.0)

## 2019-07-16 ENCOUNTER — Telehealth: Payer: Self-pay | Admitting: *Deleted

## 2019-07-16 NOTE — Telephone Encounter (Addendum)
Unable to leave VM-1st attempt  ----- Message from Nori Riis, PA-C sent at 07/16/2019  7:55 AM EDT ----- Please let [name] know that his PSA is unchanged at 0.2.  This is good.  We will see him next year.

## 2019-07-16 NOTE — Telephone Encounter (Signed)
[name] called back and I gave him his results.

## 2019-07-31 DIAGNOSIS — I5022 Chronic systolic (congestive) heart failure: Secondary | ICD-10-CM | POA: Diagnosis not present

## 2019-10-19 DIAGNOSIS — Z87891 Personal history of nicotine dependence: Secondary | ICD-10-CM | POA: Diagnosis not present

## 2019-10-19 DIAGNOSIS — D62 Acute posthemorrhagic anemia: Secondary | ICD-10-CM | POA: Diagnosis not present

## 2019-10-19 DIAGNOSIS — I10 Essential (primary) hypertension: Secondary | ICD-10-CM | POA: Diagnosis not present

## 2019-10-19 DIAGNOSIS — E7800 Pure hypercholesterolemia, unspecified: Secondary | ICD-10-CM | POA: Diagnosis not present

## 2019-10-19 DIAGNOSIS — I420 Dilated cardiomyopathy: Secondary | ICD-10-CM | POA: Diagnosis not present

## 2019-10-19 DIAGNOSIS — Z0000 Encounter for general adult medical examination without abnormal findings: Secondary | ICD-10-CM | POA: Diagnosis not present

## 2019-10-19 DIAGNOSIS — F3341 Major depressive disorder, recurrent, in partial remission: Secondary | ICD-10-CM | POA: Diagnosis not present

## 2019-10-19 DIAGNOSIS — F419 Anxiety disorder, unspecified: Secondary | ICD-10-CM | POA: Diagnosis not present

## 2019-10-19 DIAGNOSIS — N401 Enlarged prostate with lower urinary tract symptoms: Secondary | ICD-10-CM | POA: Diagnosis not present

## 2019-10-29 DIAGNOSIS — I420 Dilated cardiomyopathy: Secondary | ICD-10-CM | POA: Diagnosis not present

## 2019-10-29 DIAGNOSIS — I5022 Chronic systolic (congestive) heart failure: Secondary | ICD-10-CM | POA: Diagnosis not present

## 2019-10-29 DIAGNOSIS — I10 Essential (primary) hypertension: Secondary | ICD-10-CM | POA: Diagnosis not present

## 2019-10-29 DIAGNOSIS — I2510 Atherosclerotic heart disease of native coronary artery without angina pectoris: Secondary | ICD-10-CM | POA: Diagnosis not present

## 2019-10-29 DIAGNOSIS — I4901 Ventricular fibrillation: Secondary | ICD-10-CM | POA: Diagnosis not present

## 2019-10-30 ENCOUNTER — Other Ambulatory Visit: Payer: Self-pay | Admitting: Urology

## 2019-10-30 DIAGNOSIS — N401 Enlarged prostate with lower urinary tract symptoms: Secondary | ICD-10-CM

## 2019-10-31 DIAGNOSIS — R748 Abnormal levels of other serum enzymes: Secondary | ICD-10-CM | POA: Diagnosis not present

## 2019-10-31 DIAGNOSIS — R945 Abnormal results of liver function studies: Secondary | ICD-10-CM | POA: Diagnosis not present

## 2019-11-21 DIAGNOSIS — F419 Anxiety disorder, unspecified: Secondary | ICD-10-CM | POA: Diagnosis not present

## 2019-11-21 DIAGNOSIS — Z87891 Personal history of nicotine dependence: Secondary | ICD-10-CM | POA: Diagnosis not present

## 2019-11-21 DIAGNOSIS — F3341 Major depressive disorder, recurrent, in partial remission: Secondary | ICD-10-CM | POA: Diagnosis not present

## 2019-12-04 DIAGNOSIS — R748 Abnormal levels of other serum enzymes: Secondary | ICD-10-CM | POA: Diagnosis not present

## 2019-12-10 DIAGNOSIS — H2513 Age-related nuclear cataract, bilateral: Secondary | ICD-10-CM | POA: Diagnosis not present

## 2019-12-27 DIAGNOSIS — F419 Anxiety disorder, unspecified: Secondary | ICD-10-CM | POA: Diagnosis not present

## 2019-12-27 DIAGNOSIS — Z87891 Personal history of nicotine dependence: Secondary | ICD-10-CM | POA: Diagnosis not present

## 2019-12-27 DIAGNOSIS — F3341 Major depressive disorder, recurrent, in partial remission: Secondary | ICD-10-CM | POA: Diagnosis not present

## 2020-01-30 DIAGNOSIS — Z87891 Personal history of nicotine dependence: Secondary | ICD-10-CM | POA: Diagnosis not present

## 2020-01-30 DIAGNOSIS — F419 Anxiety disorder, unspecified: Secondary | ICD-10-CM | POA: Diagnosis not present

## 2020-01-30 DIAGNOSIS — F3341 Major depressive disorder, recurrent, in partial remission: Secondary | ICD-10-CM | POA: Diagnosis not present

## 2020-02-05 DIAGNOSIS — I5022 Chronic systolic (congestive) heart failure: Secondary | ICD-10-CM | POA: Diagnosis not present

## 2020-02-15 ENCOUNTER — Ambulatory Visit: Payer: Medicare HMO | Attending: Internal Medicine

## 2020-02-15 DIAGNOSIS — Z23 Encounter for immunization: Secondary | ICD-10-CM | POA: Insufficient documentation

## 2020-02-15 NOTE — Progress Notes (Signed)
   Covid-19 Vaccination Clinic  Name:  [name]    MRN: [id number] DOB: 12/12/1946  02/15/2020  [name] was observed post Covid-19 immunization for 15 minutes without incidence. He was provided with Vaccine Information Sheet and instruction to access the V-Safe system.   [name] was instructed to call 911 with any severe reactions post vaccine: Marland Kitchen Difficulty breathing  . Swelling of your face and throat  . A fast heartbeat  . A bad rash all over your body  . Dizziness and weakness    Immunizations Administered    Name Date Dose VIS Date Route   Pfizer COVID-19 Vaccine 02/15/2020 12:09 PM 0.3 mL 11/30/2019 Intramuscular   Manufacturer: [employer], [employer]   Lot: [id number]   NDC: [address]-7

## 2020-03-12 ENCOUNTER — Ambulatory Visit: Payer: Medicare HMO | Attending: Internal Medicine

## 2020-03-12 DIAGNOSIS — Z23 Encounter for immunization: Secondary | ICD-10-CM

## 2020-03-12 NOTE — Progress Notes (Signed)
   Covid-19 Vaccination Clinic  Name:  [name]    MRN: [id number] DOB: [birth year]  03/12/2020  [name] was observed post Covid-19 immunization for 15 minutes without incident. He was provided with Vaccine Information Sheet and instruction to access the V-Safe system.   [name] was instructed to call 911 with any severe reactions post vaccine: Marland Kitchen Difficulty breathing  . Swelling of face and throat  . A fast heartbeat  . A bad rash all over body  . Dizziness and weakness   Immunizations Administered    Name Date Dose VIS Date Route   Pfizer COVID-19 Vaccine 03/12/2020  3:56 PM 0.3 mL 11/30/2019 Intramuscular   Manufacturer: [employer], [employer]   Lot: [id number]   NDC: [address]-4

## 2020-04-01 ENCOUNTER — Other Ambulatory Visit: Payer: Self-pay | Admitting: Family Medicine

## 2020-04-01 ENCOUNTER — Other Ambulatory Visit: Payer: Self-pay

## 2020-04-01 ENCOUNTER — Ambulatory Visit
Admission: RE | Admit: 2020-04-01 | Discharge: 2020-04-01 | Disposition: A | Discharge status: Home / Self Care | Payer: Medicare HMO | Source: Ambulatory Visit | Attending: Family Medicine | Admitting: Family Medicine

## 2020-04-01 ENCOUNTER — Encounter (INDEPENDENT_AMBULATORY_CARE_PROVIDER_SITE_OTHER): Payer: Self-pay

## 2020-04-01 ENCOUNTER — Other Ambulatory Visit (HOSPITAL_COMMUNITY): Payer: Self-pay | Admitting: Family Medicine

## 2020-04-01 DIAGNOSIS — S0990XA Unspecified injury of head, initial encounter: Secondary | ICD-10-CM | POA: Diagnosis not present

## 2020-04-01 DIAGNOSIS — R519 Headache, unspecified: Secondary | ICD-10-CM | POA: Diagnosis not present

## 2020-04-01 DIAGNOSIS — S20211A Contusion of right front wall of thorax, initial encounter: Secondary | ICD-10-CM | POA: Diagnosis not present

## 2020-04-01 DIAGNOSIS — W01198A Fall on same level from slipping, tripping and stumbling with subsequent striking against other object, initial encounter: Secondary | ICD-10-CM | POA: Diagnosis not present

## 2020-04-02 DIAGNOSIS — S20219A Contusion of unspecified front wall of thorax, initial encounter: Secondary | ICD-10-CM | POA: Diagnosis not present

## 2020-04-14 ENCOUNTER — Other Ambulatory Visit: Payer: Self-pay | Admitting: Urology

## 2020-04-14 DIAGNOSIS — N138 Other obstructive and reflux uropathy: Secondary | ICD-10-CM

## 2020-05-01 DIAGNOSIS — F3341 Major depressive disorder, recurrent, in partial remission: Secondary | ICD-10-CM | POA: Diagnosis not present

## 2020-05-01 DIAGNOSIS — D649 Anemia, unspecified: Secondary | ICD-10-CM | POA: Diagnosis not present

## 2020-05-01 DIAGNOSIS — I420 Dilated cardiomyopathy: Secondary | ICD-10-CM | POA: Diagnosis not present

## 2020-05-01 DIAGNOSIS — R3912 Poor urinary stream: Secondary | ICD-10-CM | POA: Diagnosis not present

## 2020-05-01 DIAGNOSIS — N401 Enlarged prostate with lower urinary tract symptoms: Secondary | ICD-10-CM | POA: Diagnosis not present

## 2020-05-01 DIAGNOSIS — I10 Essential (primary) hypertension: Secondary | ICD-10-CM | POA: Diagnosis not present

## 2020-05-01 DIAGNOSIS — E7800 Pure hypercholesterolemia, unspecified: Secondary | ICD-10-CM | POA: Diagnosis not present

## 2020-05-01 DIAGNOSIS — I4901 Ventricular fibrillation: Secondary | ICD-10-CM | POA: Diagnosis not present

## 2020-05-01 DIAGNOSIS — F419 Anxiety disorder, unspecified: Secondary | ICD-10-CM | POA: Diagnosis not present

## 2020-05-09 DIAGNOSIS — I4901 Ventricular fibrillation: Secondary | ICD-10-CM | POA: Diagnosis not present

## 2020-05-09 DIAGNOSIS — I5022 Chronic systolic (congestive) heart failure: Secondary | ICD-10-CM | POA: Diagnosis not present

## 2020-05-09 DIAGNOSIS — I420 Dilated cardiomyopathy: Secondary | ICD-10-CM | POA: Diagnosis not present

## 2020-05-09 DIAGNOSIS — I10 Essential (primary) hypertension: Secondary | ICD-10-CM | POA: Diagnosis not present

## 2020-05-09 DIAGNOSIS — Z95810 Presence of automatic (implantable) cardiac defibrillator: Secondary | ICD-10-CM | POA: Diagnosis not present

## 2020-07-15 ENCOUNTER — Other Ambulatory Visit: Payer: Self-pay | Admitting: *Deleted

## 2020-07-15 DIAGNOSIS — N138 Other obstructive and reflux uropathy: Secondary | ICD-10-CM

## 2020-07-16 ENCOUNTER — Other Ambulatory Visit: Payer: Medicare HMO

## 2020-07-16 DIAGNOSIS — N138 Other obstructive and reflux uropathy: Secondary | ICD-10-CM | POA: Diagnosis not present

## 2020-07-16 DIAGNOSIS — N401 Enlarged prostate with lower urinary tract symptoms: Secondary | ICD-10-CM | POA: Diagnosis not present

## 2020-07-17 LAB — PSA: Prostate Specific Ag, Serum: 0.2 ng/mL (ref 0.0–4.0)

## 2020-07-17 NOTE — Progress Notes (Signed)
10:29 PM   [name] 1946-07-08 [id number]  Referring provider: Marina Goodell, MD [address] [place],  [place] [address]  Chief Complaint  Patient presents with  . Benign Prostatic Hypertrophy    1year    HPI: [name] is a 74 year old M with nocturia, ED and BPH with LUTS who presents today for follow up.    BPH WITH LUTS  (prostate and/or bladder) IPSS score: 26/4     Previous score: 27/5  Previous PVR: 0 mL     Major complaint(s):  Frequency and difficulty urinating.  He states after he takes his Lasix in the morning, he urinates frequently.  In the afternoon, his frequency resolves only to pick back up during the night.  He states he has no pedal edema in the morning, but it will occur in the afternoon.  He states he drinks a lot of water.    Currently taking: finasteride 5 mg daily and tamsulosin 0.4 mg daily   His has had urethral dilations in the past.  He underwent a cystoscopy with Dr. Apolinar Junes on 05/30/2019 and it was NED.     Patient denies any modifying or aggravating factors.  Patient denies any gross hematuria, dysuria or suprapubic/flank pain.  Patient denies any fevers, chills, nausea or vomiting.   He does not have a family history of PCa.  UA today is benign.    IPSS    Row Name 07/18/20 0900         International Prostate Symptom Score   How often have you had the sensation of not emptying your bladder? More than half the time     How often have you had to urinate less than every two hours? More than half the time     How often have you found you stopped and started again several times when you urinated? More than half the time     How often have you found it difficult to postpone urination? Less than half the time     How often have you had a weak urinary stream? More than half the time     How often have you had to strain to start urination? More than half the time     How many times did you typically get up at night to urinate?  4 Times     Total IPSS Score 26       Quality of Life due to urinary symptoms   If you were to spend the rest of your life with your urinary condition just the way it is now how would you feel about that? Mostly Disatisfied            Score:  1-7 Mild 8-19 Moderate 20-35 Severe    PMH: Past Medical History:  Diagnosis Date  . Acute prostatitis   . Balanitis   . Benign enlargement of prostate   . Cardiomyopathy, dilated (HCC)   . Frequency   . HLD (hyperlipidemia)   . HTN (hypertension)   . Hyperlipidemia   . Incomplete bladder emptying   . Nocturia   . Paroxysmal ventricular fibrillation (HCC)   . Phimosis   . Single vessel coronary artery disease   . Systolic CHF [place])     Surgical History: Past Surgical History:  Procedure Laterality Date  . defribrillator     Implant  . REPLACEMENT TOTAL KNEE    . SHOULDER ARTHROSCOPY WITH OPEN ROTATOR CUFF REPAIR Right 08/06/2015  Procedure: SHOULDER ARTHROSCOPY WITH MINI OPEN ROTATOR CUFF REPAIR, SUBACROMIAL DECOPRESSION, RELEASE LONGHEAD BICEP TENDON ;  Surgeon: Erin Sons, MD;  Location: [facility] ORS;  Service: Orthopedics;  Laterality: Right;    Home Medications:  Allergies as of 07/18/2020   No Known Allergies     Medication List       Accurate as of July 18, 2020 11:59 PM. If you have any questions, ask your nurse or doctor.        amiodarone 200 MG tablet Commonly known as: PACERONE Take 200 mg by mouth daily.   aspirin EC 81 MG tablet Take 81 mg by mouth daily. Reported on 01/06/2016   atorvastatin 40 MG tablet Commonly known as: LIPITOR Take 40 mg by mouth daily.   betamethasone dipropionate 0.05 % cream Apply topically 2 (two) times daily.   celecoxib 100 MG capsule Commonly known as: CELEBREX TAKE 1 CAPSULE (100 MG TOTAL) BY MOUTH EVERY OTHER DAY   enalapril 2.5 MG tablet Commonly known as: VASOTEC Take 2.5 mg by mouth daily.   finasteride 5 MG tablet Commonly known as: PROSCAR TAKE 1  TABLET BY MOUTH EVERY DAY   fluticasone 50 MCG/ACT nasal spray Commonly known as: FLONASE Place into both nostrils daily.   furosemide 40 MG tablet Commonly known as: LASIX Take 40 mg by mouth daily.   Gemtesa 75 MG Tabs Generic drug: Vibegron Take 75 mg by mouth daily. Started by: Michiel Cowboy, PA-C   Klor-Con M20 20 MEQ tablet Generic drug: potassium chloride SA Reported on 05/04/2016   sertraline 50 MG tablet Commonly known as: ZOLOFT TAKE 1.5 TABLETS (75 MG TOTAL) BY MOUTH ONCE DAILY   spironolactone 12.5 mg Tabs tablet Commonly known as: ALDACTONE Take 12.5 mg by mouth daily.   tamsulosin 0.4 MG Caps capsule Commonly known as: FLOMAX TAKE 1 CAPSULE BY MOUTH EVERY DAY   Ventolin HFA 108 (90 Base) MCG/ACT inhaler Generic drug: albuterol Inhale into the lungs.       Allergies: No Known Allergies  Family History: Family History  Problem Relation Age of Onset  . Kidney disease Mother   . Bladder Cancer Mother   . Prostate cancer Neg Hx   . Kidney cancer Neg Hx     Social History:  reports that he quit smoking about 13 years ago. He has never used smokeless tobacco. He reports that he does not drink alcohol and does not use drugs.  ROS: For pertinent review of systems please refer to history of present illness  Physical Exam: BP (!) 118/62   Pulse 70   Ht 5\' 4"  (1.626 m)   Wt 150 lb (68 kg)   BMI 25.75 kg/m   Constitutional:  Well nourished. Alert and oriented, No acute distress. HEENT: [place] AT, mask in place.  Trachea midline Cardiovascular: No clubbing, cyanosis, or edema. Respiratory: Normal respiratory effort, no increased work of breathing. GI: Abdomen is soft, non tender, non distended, no abdominal masses. Liver and spleen not palpable.  No hernias appreciated.  Stool sample for occult testing is not indicated.   GU: No CVA tenderness.  No bladder fullness or masses.  Patient with circumcised phallus.  Urethral meatus is patent.  No penile  discharge. No penile lesions or rashes. Scrotum without lesions, cysts, rashes and/or edema.  Testicles are located scrotally bilaterally. No masses are appreciated in the testicles. Left and right epididymis are normal. Rectal: Patient with  normal sphincter tone. Anus and perineum without scarring or rashes. No rectal masses are  appreciated. Prostate is approximately 55 grams, no nodules are appreciated. Seminal vesicles could not be palpated Skin: No rashes, bruises or suspicious lesions. Lymph: No inguinal adenopathy. Neurologic: Grossly intact, no focal deficits, moving all 4 extremities. Psychiatric: Normal mood and affect.  Laboratory Data: Component     Latest Ref Rng & Units 07/18/2020  Specific Gravity, UA     1.005 - 1.030 1.020  pH, UA     5.0 - 7.5 6.0  Color, UA     Yellow Yellow  Appearance Ur     Clear Clear  Leukocytes,UA     Negative Negative  Protein,UA     Negative/Trace Negative  Glucose, UA     Negative Negative  Ketones, UA     Negative Negative  RBC, UA     Negative Negative  Bilirubin, UA     Negative Negative  Urobilinogen, Ur     0.2 - 1.0 mg/dL 1.0  Nitrite, UA     Negative Negative  Microscopic Examination      See below:   Component     Latest Ref Rng & Units 07/18/2020  WBC, UA     0 - 5 /hpf 0-5  RBC     0 - 2 /hpf None seen  Epithelial Cells (non renal)     0 - 10 /hpf 0-10  Bacteria, UA     None seen/Few None seen    Lab Results  Component Value Date   WBC 6.6 07/28/2015   HGB 12.7 (L) 07/28/2015   HCT 38.2 (L) 07/28/2015   MCV 95.2 07/28/2015   PLT 248 07/28/2015    Lab Results  Component Value Date   CREATININE 1.15 07/28/2015   PSA History:  2.0 (4.0) ng/mL on 05/20/2014  1.0 ng/mL (2.0) on 12/02/2014  0.4 ng/mL (0.8) on 07/09/2015  Component     Latest Ref Rng & Units 12/04/2015 04/28/2016 05/17/2017 06/14/2018  Prostate Specific Ag, Serum     0.0 - 4.0 ng/mL 0.3 0.2 0.3 0.2   Component     Latest Ref Rng & Units  07/13/2019 07/16/2020  Prostate Specific Ag, Serum     0.0 - 4.0 ng/mL 0.2 0.2   Lab Results  Component Value Date   HGBA1C 6.3 04/24/2014   I have reviewed the labs.  Pertinent Imaging No recent imagaing  Assessment & Plan:    1. BPH with LUTS IPSS score is 26/4, it is worsening Continue conservative management, avoiding bladder irritants and timed voiding's Most bothersome symptoms is/are nocturia Continue tamsulosin 0.4 mg daily and finasteride 5 mg daily  RTC in 12 months for IPSS, PSA, PVR and exam   2. Nocturia As he has no lower extremity edema in the morning, I have suggested that he take his Lasix in the afternoon.  He states he has tried that in the past and it did not alleviate his nocturia.  He denies any sleep apnea, but he has not been tested.  I will give him Gemtesa 75 mg, # 28 samples.  He will RTC in 3 weeks for I PSS and PVR  Return in about 3 weeks (around 08/08/2020) for IPSS and PVR.  Michiel Cowboy, PA-C  [facility] [address] [place], [place] [address] [phone]

## 2020-07-18 ENCOUNTER — Encounter: Payer: Self-pay | Admitting: Urology

## 2020-07-18 ENCOUNTER — Other Ambulatory Visit: Payer: Self-pay

## 2020-07-18 ENCOUNTER — Ambulatory Visit (INDEPENDENT_AMBULATORY_CARE_PROVIDER_SITE_OTHER): Payer: Medicare HMO | Admitting: Urology

## 2020-07-18 VITALS — BP 118/62 | HR 70 | Ht 64.0 in | Wt 150.0 lb

## 2020-07-18 DIAGNOSIS — N401 Enlarged prostate with lower urinary tract symptoms: Secondary | ICD-10-CM

## 2020-07-18 DIAGNOSIS — N138 Other obstructive and reflux uropathy: Secondary | ICD-10-CM | POA: Diagnosis not present

## 2020-07-18 DIAGNOSIS — R351 Nocturia: Secondary | ICD-10-CM

## 2020-07-18 LAB — URINALYSIS, COMPLETE
Bilirubin, UA: NEGATIVE
Glucose, UA: NEGATIVE
Ketones, UA: NEGATIVE
Leukocytes,UA: NEGATIVE
Nitrite, UA: NEGATIVE
Protein,UA: NEGATIVE
RBC, UA: NEGATIVE
Specific Gravity, UA: 1.02 (ref 1.005–1.030)
Urobilinogen, Ur: 1 mg/dL (ref 0.2–1.0)
pH, UA: 6 (ref 5.0–7.5)

## 2020-07-18 LAB — MICROSCOPIC EXAMINATION
Bacteria, UA: NONE SEEN
RBC, Urine: NONE SEEN /hpf (ref 0–2)

## 2020-07-18 MED ORDER — GEMTESA 75 MG PO TABS: 75.0000 mg | ORAL_TABLET | Freq: Every day | ORAL | 0 refills | Status: DC

## 2020-07-23 ENCOUNTER — Other Ambulatory Visit: Payer: Self-pay | Admitting: Urology

## 2020-07-23 DIAGNOSIS — N401 Enlarged prostate with lower urinary tract symptoms: Secondary | ICD-10-CM

## 2020-08-06 NOTE — Progress Notes (Signed)
10:30 AM   [name] 1946-04-20 [id number]  Referring provider: Marina Goodell, MD [address] [place],  [place] [address]  Chief Complaint  Patient presents with  . Benign Prostatic Hypertrophy    HPI: [name] is a 74 year old M with nocturia, ED and BPH with LUTS who presents today for follow up after a trial for [facility] for nocturia.   BPH WITH LUTS  (prostate and/or bladder) IPSS score: 17/2    Previous score: 26/4  Previous PVR: 0 mL     Major complaint(s):  Frequency.  Nocturia has reduced to one or two at night.  He is also taking his Lasix in the afternoon.   Currently taking: finasteride 5 mg daily, tamsulosin 0.4 mg daily and Gemtesa 75 mg daily  His has had urethral dilations in the past.  He underwent a cystoscopy with Dr. Apolinar Junes on 05/30/2019 and it was NED.     Patient denies any modifying or aggravating factors.  Patient denies any gross hematuria, dysuria or suprapubic/flank pain.  Patient denies any fevers, chills, nausea or vomiting.   He does not have a family history of PCa.    IPSS    Row Name 08/07/20 0900         International Prostate Symptom Score   How often have you had the sensation of not emptying your bladder? Less than half the time     How often have you had to urinate less than every two hours? Less than half the time     How often have you found you stopped and started again several times when you urinated? About half the time     How often have you found it difficult to postpone urination? Less than 1 in 5 times     How often have you had a weak urinary stream? About half the time     How often have you had to strain to start urination? More than half the time     How many times did you typically get up at night to urinate? 2 Times     Total IPSS Score 17       Quality of Life due to urinary symptoms   If you were to spend the rest of your life with your urinary condition just the way it is now how would you feel  about that? Mostly Satisfied            Score:  1-7 Mild 8-19 Moderate 20-35 Severe    PMH: Past Medical History:  Diagnosis Date  . Acute prostatitis   . Balanitis   . Benign enlargement of prostate   . Cardiomyopathy, dilated (HCC)   . Frequency   . HLD (hyperlipidemia)   . HTN (hypertension)   . Hyperlipidemia   . Incomplete bladder emptying   . Nocturia   . Paroxysmal ventricular fibrillation (HCC)   . Phimosis   . Single vessel coronary artery disease   . Systolic CHF [facility])     Surgical History: Past Surgical History:  Procedure Laterality Date  . defribrillator     Implant  . REPLACEMENT TOTAL KNEE    . SHOULDER ARTHROSCOPY WITH OPEN ROTATOR CUFF REPAIR Right 08/06/2015   Procedure: SHOULDER ARTHROSCOPY WITH MINI OPEN ROTATOR CUFF REPAIR, SUBACROMIAL DECOPRESSION, RELEASE LONGHEAD BICEP TENDON ;  Surgeon: Erin Sons, MD;  Location: [facility] ORS;  Service: Orthopedics;  Laterality: Right;    Home Medications:  Allergies as of  08/07/2020   No Known Allergies     Medication List       Accurate as of August 07, 2020 10:30 AM. If you have any questions, ask your nurse or doctor.        amiodarone 200 MG tablet Commonly known as: PACERONE Take 200 mg by mouth daily.   aspirin EC 81 MG tablet Take 81 mg by mouth daily. Reported on 01/06/2016   atorvastatin 40 MG tablet Commonly known as: LIPITOR Take 40 mg by mouth daily.   betamethasone dipropionate 0.05 % cream Apply topically 2 (two) times daily.   carvedilol 12.5 MG tablet Commonly known as: COREG   celecoxib 100 MG capsule Commonly known as: CELEBREX TAKE 1 CAPSULE (100 MG TOTAL) BY MOUTH EVERY OTHER DAY   enalapril 2.5 MG tablet Commonly known as: VASOTEC Take 2.5 mg by mouth daily.   finasteride 5 MG tablet Commonly known as: PROSCAR TAKE 1 TABLET BY MOUTH EVERY DAY   fluticasone 50 MCG/ACT nasal spray Commonly known as: FLONASE Place into both nostrils daily.   furosemide 40  MG tablet Commonly known as: LASIX Take 40 mg by mouth daily.   Gemtesa 75 MG Tabs Generic drug: Vibegron Take 75 mg by mouth daily. What changed: Another medication with the same name was added. Make sure you understand how and when to take each. Changed by: Michiel Cowboy, PA-C   Gemtesa 75 MG Tabs Generic drug: Vibegron Take 75 mg by mouth daily. What changed: You were already taking a medication with the same name, and this prescription was added. Make sure you understand how and when to take each. Changed by: Michiel Cowboy, PA-C   Klor-Con M20 20 MEQ tablet Generic drug: potassium chloride SA Reported on 05/04/2016   sertraline 50 MG tablet Commonly known as: ZOLOFT TAKE 1.5 TABLETS (75 MG TOTAL) BY MOUTH ONCE DAILY   spironolactone 12.5 mg Tabs tablet Commonly known as: ALDACTONE Take 12.5 mg by mouth daily.   tamsulosin 0.4 MG Caps capsule Commonly known as: FLOMAX TAKE 1 CAPSULE BY MOUTH EVERY DAY   Ventolin HFA 108 (90 Base) MCG/ACT inhaler Generic drug: albuterol Inhale into the lungs.       Allergies: No Known Allergies  Family History: Family History  Problem Relation Age of Onset  . Kidney disease Mother   . Bladder Cancer Mother   . Prostate cancer Neg Hx   . Kidney cancer Neg Hx     Social History:  reports that he quit smoking about 13 years ago. He has never used smokeless tobacco. He reports that he does not drink alcohol and does not use drugs.  ROS: For pertinent review of systems please refer to history of present illness  Physical Exam: BP 111/66   Pulse 73   Ht 5\' 4"  (1.626 m)   Wt 150 lb (68 kg)   BMI 25.75 kg/m   Constitutional:  Well nourished. Alert and oriented, No acute distress. HEENT: [place] AT, mask in place.  Trachea midline Cardiovascular: No clubbing, cyanosis, or edema. Respiratory: Normal respiratory effort, no increased work of breathing. Neurologic: Grossly intact, no focal deficits, moving all 4  extremities. Psychiatric: Normal mood and affect.  Laboratory Data: Component     Latest Ref Rng & Units 07/18/2020  Specific Gravity, UA     1.005 - 1.030 1.020  pH, UA     5.0 - 7.5 6.0  Color, UA     Yellow Yellow  Appearance Ur  Clear Clear  Leukocytes,UA     Negative Negative  Protein,UA     Negative/Trace Negative  Glucose, UA     Negative Negative  Ketones, UA     Negative Negative  RBC, UA     Negative Negative  Bilirubin, UA     Negative Negative  Urobilinogen, Ur     0.2 - 1.0 mg/dL 1.0  Nitrite, UA     Negative Negative  Microscopic Examination      See below:   Component     Latest Ref Rng & Units 07/18/2020  WBC, UA     0 - 5 /hpf 0-5  RBC     0 - 2 /hpf None seen  Epithelial Cells (non renal)     0 - 10 /hpf 0-10  Bacteria, UA     None seen/Few None seen    Lab Results  Component Value Date   WBC 6.6 07/28/2015   HGB 12.7 (L) 07/28/2015   HCT 38.2 (L) 07/28/2015   MCV 95.2 07/28/2015   PLT 248 07/28/2015    Lab Results  Component Value Date   CREATININE 1.15 07/28/2015   PSA History:  2.0 (4.0) ng/mL on 05/20/2014  1.0 ng/mL (2.0) on 12/02/2014  0.4 ng/mL (0.8) on 07/09/2015  Component     Latest Ref Rng & Units 12/04/2015 04/28/2016 05/17/2017 06/14/2018  Prostate Specific Ag, Serum     0.0 - 4.0 ng/mL 0.3 0.2 0.3 0.2   Component     Latest Ref Rng & Units 07/13/2019 07/16/2020  Prostate Specific Ag, Serum     0.0 - 4.0 ng/mL 0.2 0.2   Lab Results  Component Value Date   HGBA1C 6.3 04/24/2014   I have reviewed the labs.  Pertinent Imaging Results for TIANDRE, TEALL (MRN [id number]) as of 08/07/2020 10:27  Ref. Range 08/07/2020 09:57  Scan Result Unknown 55    Assessment & Plan:    1. BPH with LUTS IPSS score is 17/2, it is improving Continue conservative management, avoiding bladder irritants and timed voiding's Most bothersome symptoms is/are nocturia - improved with Gemtesa and taking the Lasix in the  afternoon Continue tamsulosin 0.4 mg daily and finasteride 5 mg daily  RTC in 12 months for IPSS, PSA, PVR and exam   2. Nocturia Continue Gemtesa 75 mg - script given  Return in about 1 year (around 08/07/2021) for  PSA, IPSS, PVR and exam - PSA prior .  Michiel Cowboy, PA-C  [facility] [address] [place], [place] [address] [phone]

## 2020-08-07 ENCOUNTER — Other Ambulatory Visit: Payer: Self-pay

## 2020-08-07 ENCOUNTER — Ambulatory Visit: Payer: Medicare HMO | Admitting: Urology

## 2020-08-07 ENCOUNTER — Encounter: Payer: Self-pay | Admitting: Urology

## 2020-08-07 VITALS — BP 111/66 | HR 73 | Ht 64.0 in | Wt 150.0 lb

## 2020-08-07 DIAGNOSIS — N401 Enlarged prostate with lower urinary tract symptoms: Secondary | ICD-10-CM

## 2020-08-07 DIAGNOSIS — R351 Nocturia: Secondary | ICD-10-CM

## 2020-08-07 DIAGNOSIS — N138 Other obstructive and reflux uropathy: Secondary | ICD-10-CM

## 2020-08-07 LAB — BLADDER SCAN AMB NON-IMAGING: Scan Result: 55

## 2020-08-07 MED ORDER — GEMTESA 75 MG PO TABS: 75.0000 mg | ORAL_TABLET | Freq: Every day | ORAL | 3 refills | Status: DC

## 2020-08-12 DIAGNOSIS — I5022 Chronic systolic (congestive) heart failure: Secondary | ICD-10-CM | POA: Diagnosis not present

## 2020-08-28 ENCOUNTER — Other Ambulatory Visit: Payer: Self-pay | Admitting: Urology

## 2020-08-28 ENCOUNTER — Telehealth: Payer: Self-pay | Admitting: Urology

## 2020-08-28 NOTE — Telephone Encounter (Signed)
Patient received samples of Gemtesa at his last visit with [facility].  The medication worked really well to resolve his symptoms.  He is out of samples.  I called the [facility] in [place] to inquire about the prescription.  For a 90-day supply, the medication is $285.  The patient states that he cannot afford this medication.  Is there an alternate that he can try?  Please advise.

## 2020-08-28 NOTE — Telephone Encounter (Signed)
This is so confusing because when I put in Myrbetriq, Epic says his insurance recommends to try Gemtesa first.   I guess we can call [facility] and ask what his insurance says about the price of Myrbetriq 50 mg daily.

## 2020-11-07 DIAGNOSIS — I5022 Chronic systolic (congestive) heart failure: Secondary | ICD-10-CM | POA: Diagnosis not present

## 2020-11-07 DIAGNOSIS — Z95810 Presence of automatic (implantable) cardiac defibrillator: Secondary | ICD-10-CM | POA: Diagnosis not present

## 2020-11-07 DIAGNOSIS — I2510 Atherosclerotic heart disease of native coronary artery without angina pectoris: Secondary | ICD-10-CM | POA: Diagnosis not present

## 2020-11-07 DIAGNOSIS — I420 Dilated cardiomyopathy: Secondary | ICD-10-CM | POA: Diagnosis not present

## 2020-11-07 DIAGNOSIS — I4901 Ventricular fibrillation: Secondary | ICD-10-CM | POA: Diagnosis not present

## 2020-11-07 DIAGNOSIS — I10 Essential (primary) hypertension: Secondary | ICD-10-CM | POA: Diagnosis not present

## 2020-11-07 DIAGNOSIS — E7800 Pure hypercholesterolemia, unspecified: Secondary | ICD-10-CM | POA: Diagnosis not present

## 2020-11-24 DIAGNOSIS — F3341 Major depressive disorder, recurrent, in partial remission: Secondary | ICD-10-CM | POA: Diagnosis not present

## 2020-11-24 DIAGNOSIS — Z0000 Encounter for general adult medical examination without abnormal findings: Secondary | ICD-10-CM | POA: Diagnosis not present

## 2020-11-24 DIAGNOSIS — I420 Dilated cardiomyopathy: Secondary | ICD-10-CM | POA: Diagnosis not present

## 2020-11-24 DIAGNOSIS — I4901 Ventricular fibrillation: Secondary | ICD-10-CM | POA: Diagnosis not present

## 2020-11-24 DIAGNOSIS — I10 Essential (primary) hypertension: Secondary | ICD-10-CM | POA: Diagnosis not present

## 2020-11-24 DIAGNOSIS — N401 Enlarged prostate with lower urinary tract symptoms: Secondary | ICD-10-CM | POA: Diagnosis not present

## 2020-11-24 DIAGNOSIS — R3912 Poor urinary stream: Secondary | ICD-10-CM | POA: Diagnosis not present

## 2020-11-24 DIAGNOSIS — E7800 Pure hypercholesterolemia, unspecified: Secondary | ICD-10-CM | POA: Diagnosis not present

## 2020-11-24 DIAGNOSIS — D649 Anemia, unspecified: Secondary | ICD-10-CM | POA: Diagnosis not present

## 2021-03-10 DIAGNOSIS — I5022 Chronic systolic (congestive) heart failure: Secondary | ICD-10-CM | POA: Diagnosis not present

## 2021-04-01 ENCOUNTER — Other Ambulatory Visit: Payer: Self-pay | Admitting: Urology

## 2021-04-01 DIAGNOSIS — N138 Other obstructive and reflux uropathy: Secondary | ICD-10-CM

## 2021-04-01 DIAGNOSIS — N401 Enlarged prostate with lower urinary tract symptoms: Secondary | ICD-10-CM

## 2021-04-18 ENCOUNTER — Ambulatory Visit
Admission: EM | Admit: 2021-04-18 | Discharge: 2021-04-18 | Disposition: A | Discharge status: Home / Self Care | Payer: Medicare HMO | Attending: Sports Medicine | Admitting: Sports Medicine

## 2021-04-18 ENCOUNTER — Other Ambulatory Visit: Payer: Self-pay

## 2021-04-18 ENCOUNTER — Encounter: Payer: Self-pay | Admitting: Emergency Medicine

## 2021-04-18 DIAGNOSIS — B349 Viral infection, unspecified: Secondary | ICD-10-CM | POA: Diagnosis not present

## 2021-04-18 DIAGNOSIS — I5020 Unspecified systolic (congestive) heart failure: Secondary | ICD-10-CM | POA: Diagnosis not present

## 2021-04-18 DIAGNOSIS — Z87891 Personal history of nicotine dependence: Secondary | ICD-10-CM | POA: Insufficient documentation

## 2021-04-18 DIAGNOSIS — Z79899 Other long term (current) drug therapy: Secondary | ICD-10-CM | POA: Insufficient documentation

## 2021-04-18 DIAGNOSIS — R0602 Shortness of breath: Secondary | ICD-10-CM | POA: Insufficient documentation

## 2021-04-18 DIAGNOSIS — R059 Cough, unspecified: Secondary | ICD-10-CM | POA: Insufficient documentation

## 2021-04-18 DIAGNOSIS — U071 COVID-19: Secondary | ICD-10-CM | POA: Insufficient documentation

## 2021-04-18 DIAGNOSIS — I420 Dilated cardiomyopathy: Secondary | ICD-10-CM | POA: Insufficient documentation

## 2021-04-18 DIAGNOSIS — Z950 Presence of cardiac pacemaker: Secondary | ICD-10-CM | POA: Diagnosis not present

## 2021-04-18 DIAGNOSIS — I480 Paroxysmal atrial fibrillation: Secondary | ICD-10-CM | POA: Diagnosis not present

## 2021-04-18 DIAGNOSIS — Z7982 Long term (current) use of aspirin: Secondary | ICD-10-CM | POA: Diagnosis not present

## 2021-04-18 DIAGNOSIS — Z7901 Long term (current) use of anticoagulants: Secondary | ICD-10-CM | POA: Insufficient documentation

## 2021-04-18 DIAGNOSIS — R0981 Nasal congestion: Secondary | ICD-10-CM

## 2021-04-18 MED ORDER — GUAIFENESIN-CODEINE 100-10 MG/5ML PO SYRP: 5.0000 mL | ORAL_SOLUTION | Freq: Four times a day (QID) | ORAL | 0 refills | Status: DC | PRN

## 2021-04-18 NOTE — ED Triage Notes (Signed)
Patient c/o sinus congestion and cough and chest congestion that started 2 days ago.  Patient reports SOB that started yesterday.  Patient denies fevers.

## 2021-04-18 NOTE — ED Provider Notes (Signed)
[facility]    CSN: [id number] Arrival date & time: 04/18/21  1232      History   Chief Complaint Chief Complaint  Patient presents with  . Cough  . Sinus Problem    HPI [name] is a 75 y.o. male presenting for 2 to 3-day history of nasal congestion, sinus pressure, and occasionally productive cough.  Patient also admits to slight increase in his baseline shortness of breath.  He does have history of CHF, cardiomyopathy, and paroxysmal ventricular fibrillation.  Patient does have pacemaker/defibrillator.  No history of asthma or COPD.  Patient states he has some baseline shortness of breath already related to this.  He has not had any recent fevers, fatigue, body aches, sore throat, chest pain, abdominal pain, nausea/vomiting or diarrhea.  No sick contacts.  No known exposure to COVID-19.  Vaccinated for COVID-19 x3.  Patient's been taking over-the-counter NyQuil but says he does not think it is helped his symptoms.  He has no other concerns today.  HPI  Past Medical History:  Diagnosis Date  . Acute prostatitis   . Balanitis   . Benign enlargement of prostate   . Cardiomyopathy, dilated (HCC)   . Frequency   . HLD (hyperlipidemia)   . HTN (hypertension)   . Hyperlipidemia   . Incomplete bladder emptying   . Nocturia   . Paroxysmal ventricular fibrillation (HCC)   . Phimosis   . Single vessel coronary artery disease   . Systolic CHF [facility])     Patient Active Problem List   Diagnosis Date Noted  . Primary osteoarthritis of left knee 02/17/2018  . Left knee pain 10/26/2017  . BXO (balanitis xerotica obliterans) 02/03/2016  . Erectile dysfunction of organic origin 02/03/2016  . History of repair of rotator cuff 09/04/2015  . Complete rotator cuff rupture of left shoulder 07/15/2015  . Nocturia 07/09/2015  . BPH with obstruction/lower urinary tract symptoms 07/09/2015  . Phimosis 07/09/2015  . Pain in shoulder 05/22/2015  . Benign prostatic  hyperplasia with urinary obstruction 10/02/2014  . Cardiomyopathy, dilated, nonischemic (HCC) 04/29/2014  . Ventricular fibrillation (HCC) 04/29/2014  . Single vessel coronary artery disease 04/29/2014  . Automatic implantable cardioverter-defibrillator in situ 03/14/2014  . HLD (hyperlipidemia) 03/14/2014  . BP (high blood pressure) 03/14/2014  . Heart failure, systolic (HCC) 03/14/2014  . Congestive heart failure with left ventricular systolic dysfunction (HCC) 03/14/2014    Past Surgical History:  Procedure Laterality Date  . defribrillator     Implant  . REPLACEMENT TOTAL KNEE    . SHOULDER ARTHROSCOPY WITH OPEN ROTATOR CUFF REPAIR Right 08/06/2015   Procedure: SHOULDER ARTHROSCOPY WITH MINI OPEN ROTATOR CUFF REPAIR, SUBACROMIAL DECOPRESSION, RELEASE LONGHEAD BICEP TENDON ;  Surgeon: Erin Sons, MD;  Location: [facility] ORS;  Service: Orthopedics;  Laterality: Right;       Home Medications    Prior to Admission medications   Medication Sig Start Date End Date Taking? Authorizing Provider  amiodarone (PACERONE) 200 MG tablet Take 200 mg by mouth daily.   Yes [provider]  aspirin EC 81 MG tablet Take 81 mg by mouth daily. Reported on 01/06/2016   Yes [provider]  betamethasone dipropionate (DIPROLENE) 0.05 % cream Apply topically 2 (two) times daily. 02/03/16  Yes McGowan, Carollee Herter A, PA-C  carvedilol (COREG) 12.5 MG tablet  07/28/20  Yes [provider]  celecoxib (CELEBREX) 100 MG capsule TAKE 1 CAPSULE (100 MG TOTAL) BY MOUTH EVERY OTHER DAY 05/31/18  Yes [provider]  enalapril (VASOTEC) 2.5 MG tablet Take 2.5 mg by mouth daily.   Yes [provider]  finasteride (PROSCAR) 5 MG tablet TAKE 1 TABLET BY MOUTH EVERY DAY 07/23/20  Yes McGowan, Carollee Herter A, PA-C  fluticasone (FLONASE) 50 MCG/ACT nasal spray Place into both nostrils daily.   Yes [provider]  furosemide (LASIX) 40 MG tablet Take 40 mg by mouth daily.   Yes  [provider]  guaiFENesin-codeine (ROBITUSSIN AC) 100-10 MG/5ML syrup Take 5 mLs by mouth 4 (four) times daily as needed for cough. 04/18/21  Yes Eusebio Friendly B, PA-C  potassium chloride SA (KLOR-CON) 20 MEQ tablet Reported on 05/04/2016 10/18/15  Yes [provider]  sertraline (ZOLOFT) 50 MG tablet TAKE 1.5 TABLETS (75 MG TOTAL) BY MOUTH ONCE DAILY 04/04/19  Yes [provider]  spironolactone (ALDACTONE) 12.5 mg TABS tablet Take 12.5 mg by mouth daily.   Yes [provider]  tamsulosin (FLOMAX) 0.4 MG CAPS capsule TAKE 1 CAPSULE BY MOUTH EVERY DAY 04/01/21  Yes McGowan, Shannon A, PA-C  Vibegron (GEMTESA) 75 MG TABS Take 75 mg by mouth daily. 07/18/20  Yes McGowan, Carollee Herter A, PA-C  albuterol (VENTOLIN HFA) 108 (90 Base) MCG/ACT inhaler Inhale into the lungs. 02/16/16   [provider]  atorvastatin (LIPITOR) 40 MG tablet Take 40 mg by mouth daily.    [provider]  Vibegron (GEMTESA) 75 MG TABS Take 75 mg by mouth daily. 08/07/20   Harle Battiest, PA-C    Family History Family History  Problem Relation Age of Onset  . Kidney disease Mother   . Bladder Cancer Mother   . Prostate cancer Neg Hx   . Kidney cancer Neg Hx     Social History Social History   Tobacco Use  . Smoking status: Former Smoker    Quit date: 2008    Years since quitting: 14.3  . Smokeless tobacco: Never Used  Vaping Use  . Vaping Use: Never used  Substance Use Topics  . Alcohol use: No    Alcohol/week: 0.0 standard drinks  . Drug use: No     Allergies   Patient has no known allergies.   Review of Systems Review of Systems  Constitutional: Negative for fatigue and fever.  HENT: Positive for congestion and rhinorrhea. Negative for sinus pain and sore throat.   Respiratory: Positive for cough and shortness of breath.   Cardiovascular: Negative for chest pain.  Gastrointestinal: Negative for abdominal pain, diarrhea, nausea and vomiting.   Musculoskeletal: Negative for myalgias.  Neurological: Negative for weakness, light-headedness and headaches.  Hematological: Negative for adenopathy.     Physical Exam Triage Vital Signs ED Triage Vitals  Enc Vitals Group     BP 04/18/21 1245 140/77     Pulse Rate 04/18/21 1245 69     Resp 04/18/21 1245 16     Temp 04/18/21 1245 98.6 F (37 C)     Temp Source 04/18/21 1245 Oral     SpO2 04/18/21 1245 100 %     Weight 04/18/21 1241 160 lb (72.6 kg)     Height 04/18/21 1241  (1.626 m)     Head Circumference --      Peak Flow --      Pain Score 04/18/21 1241 3     Pain Loc --      Pain Edu? --      Excl. in GC? --    No data found.  Updated Vital  Signs BP 140/77 (BP Location: Left Arm)   Pulse 69   Temp 98.6 F (37 C) (Oral)   Resp 16   Ht 5\' 4"  (1.626 m)   Wt 160 lb (72.6 kg)   SpO2 100%   BMI 27.46 kg/m    Physical Exam Vitals and nursing note reviewed.  Constitutional:      General: He is not in acute distress.    Appearance: Normal appearance. He is well-developed. He is not ill-appearing or diaphoretic.  HENT:     Head: Normocephalic and atraumatic.     Right Ear: Tympanic membrane, ear canal and external ear normal.     Left Ear: Tympanic membrane, ear canal and external ear normal.     Nose: Congestion and rhinorrhea present.     Mouth/Throat:     Mouth: Mucous membranes are moist.     Pharynx: Oropharynx is clear. Uvula midline. No oropharyngeal exudate.     Tonsils: No tonsillar abscesses.  Eyes:     General: No scleral icterus.       Right eye: No discharge.        Left eye: No discharge.     Conjunctiva/sclera: Conjunctivae normal.  Neck:     Thyroid: No thyromegaly.     Trachea: No tracheal deviation.  Cardiovascular:     Rate and Rhythm: Normal rate and regular rhythm.     Heart sounds: Normal heart sounds.  Pulmonary:     Effort: Pulmonary effort is normal. No respiratory distress.     Breath sounds: Normal breath sounds. No  wheezing, rhonchi or rales.  Musculoskeletal:     Cervical back: Normal range of motion and neck supple.  Lymphadenopathy:     Cervical: No cervical adenopathy.  Skin:    General: Skin is warm and dry.     Findings: No rash.  Neurological:     General: No focal deficit present.     Mental Status: He is alert. Mental status is at baseline.     Motor: No weakness.     Gait: Gait normal.  Psychiatric:        Mood and Affect: Mood normal.        Behavior: Behavior normal.        Thought Content: Thought content normal.      UC Treatments / Results  Labs (all labs ordered are listed, but only abnormal results are displayed) Labs Reviewed  SARS CORONAVIRUS 2 (TAT 6-24 HRS)    EKG   Radiology No results found.  Procedures Procedures (including critical care time)  Medications Ordered in UC Medications - No data to display  Initial Impression / Assessment and Plan / UC Course  I have reviewed the triage vital signs and the nursing notes.  Pertinent labs & imaging results that were available during my care of the patient were reviewed by me and considered in my medical decision making (see chart for details).    75 year old male presenting for cough and congestion x2 to 3 days.  In the clinic today all vital signs are normal and stable.  He is overall well-appearing.  He is not in any acute distress.  Exam only significant for very minor nasal congestion and clear rhinorrhea.  His chest is clear to auscultation heart regular rate and rhythm.  Suspect viral illness.  COVID test obtained.  Current CDC guidelines, isolation protocol and ED precautions reviewed with patient.  Advised if he test positive then he would be a candidate for antiviral  and/or antibody therapy.  Someone will contact him regarding this if positive.  In the meantime, supportive care with increasing rest and fluids.  I did send Cheratussin to pharmacy.  Reviewed controlled substance database and patient low  risk for abuse.  Reviewed ED precautions with patient including to go to ED if he develops any fever, chest pain or increase in shortness of breath that he experiences at baseline.  Patient agreeable to plan.  Final Clinical Impressions(s) / UC Diagnoses   Final diagnoses:  Viral illness  Cough  Nasal congestion     Discharge Instructions     URI/COLD SYMPTOMS: Your exam today is consistent with a viral illness. Antibiotics are not indicated at this time. Use medications as directed, including cough syrup, nasal saline, and decongestants. Your symptoms should improve over the next few days and resolve within 7-10 days. Increase rest and fluids. F/u if symptoms worsen or predominate such as sore throat, ear pain, productive cough, shortness of breath, or if you develop high fevers or worsening fatigue over the next several days.    You have received COVID testing today either for positive exposure, concerning symptoms that could be related to COVID infection, screening purposes, or re-testing after confirmed positive.  Your test obtained today checks for active viral infection in the last 1-2 weeks. If your test is negative now, you can still test positive later. So, if you do develop symptoms you should either get re-tested and/or isolate x 5 days and then strict mask use x 5 days (unvaccinated) or mask use x 10 days (vaccinated). Please follow CDC guidelines.  While Rapid antigen tests come back in 15-20 minutes, send out PCR/molecular test results typically come back within 1-3 days. In the mean time, if you are symptomatic, assume this could be a positive test and treat/monitor yourself as if you do have COVID.   We will call with test results if positive. Please download the MyChart app and set up a profile to access test results.   If symptomatic, go home and rest. Push fluids. Take Tylenol as needed for discomfort. Gargle warm salt water. Throat lozenges. Take Mucinex DM or Robitussin  for cough. Humidifier in bedroom to ease coughing. Warm showers. Also review the COVID handout for more information.  COVID-19 INFECTION: The incubation period of COVID-19 is approximately 14 days after exposure, with most symptoms developing in roughly 4-5 days. Symptoms may range in severity from mild to critically severe. Roughly 80% of those infected will have mild symptoms. People of any age may become infected with COVID-19 and have the ability to transmit the virus. The most common symptoms include: fever, fatigue, cough, body aches, headaches, sore throat, nasal congestion, shortness of breath, nausea, vomiting, diarrhea, changes in smell and/or taste.    COURSE OF ILLNESS Some patients may begin with mild disease which can progress quickly into critical symptoms. If your symptoms are worsening please call ahead to the Emergency Department and proceed there for further treatment. Recovery time appears to be roughly 1-2 weeks for mild symptoms and 3-6 weeks for severe disease.   GO IMMEDIATELY TO ER FOR FEVER YOU ARE UNABLE TO GET DOWN WITH TYLENOL, BREATHING PROBLEMS, CHEST PAIN, FATIGUE, LETHARGY, INABILITY TO EAT OR DRINK, ETC  QUARANTINE AND ISOLATION: To help decrease the spread of COVID-19 please remain isolated if you have COVID infection or are highly suspected to have COVID infection. This means -stay home and isolate to one room in the home if you live with  others. Do not share a bed or bathroom with others while ill, sanitize and wipe down all countertops and keep common areas clean and disinfected. Stay home for 5 days. If you have no symptoms or your symptoms are resolving after 5 days, you can leave your house. Continue to wear a mask around others for 5 additional days. If you have been in close contact (within 6 feet) of someone diagnosed with COVID 19, you are advised to quarantine in your home for 14 days as symptoms can develop anywhere from 2-14 days after exposure to the virus.  If you develop symptoms, you  must isolate.  Most current guidelines for COVID after exposure -unvaccinated: isolate 5 days and strict mask use x 5 days. Test on day 5 is possible -vaccinated: wear mask x 10 days if symptoms do not develop -You do not necessarily need to be tested for COVID if you have + exposure and  develop symptoms. Just isolate at home x10 days from symptom onset During this global pandemic, CDC advises to practice social distancing, try to stay at least 44ft away from others at all times. Wear a face covering. Wash and sanitize your hands regularly and avoid going anywhere that is not necessary.  KEEP IN MIND THAT THE COVID TEST IS NOT 100% ACCURATE AND YOU SHOULD STILL DO EVERYTHING TO PREVENT POTENTIAL SPREAD OF VIRUS TO OTHERS (WEAR MASK, WEAR GLOVES, WASH HANDS AND SANITIZE REGULARLY). IF INITIAL TEST IS NEGATIVE, THIS MAY NOT MEAN YOU ARE DEFINITELY NEGATIVE. MOST ACCURATE TESTING IS DONE 5-7 DAYS AFTER EXPOSURE.   It is not advised by CDC to get re-tested after receiving a positive COVID test since you can still test positive for weeks to months after you have already cleared the virus.   *If you have not been vaccinated for COVID, I strongly suggest you consider getting vaccinated as long as there are no contraindications.      ED Prescriptions    Medication Sig Dispense Auth. Provider   guaiFENesin-codeine (ROBITUSSIN AC) 100-10 MG/5ML syrup Take 5 mLs by mouth 4 (four) times daily as needed for cough. 120 mL Shirlee Latch, PA-C     PDMP not reviewed this encounter.   Shirlee Latch, PA-C 04/18/21 1338

## 2021-04-18 NOTE — Discharge Instructions (Addendum)

## 2021-04-19 ENCOUNTER — Telehealth: Payer: Self-pay | Admitting: Physician Assistant

## 2021-04-19 DIAGNOSIS — U071 COVID-19: Secondary | ICD-10-CM

## 2021-04-19 LAB — SARS CORONAVIRUS 2 (TAT 6-24 HRS): SARS Coronavirus 2: POSITIVE — AB

## 2021-04-19 MED ORDER — MOLNUPIRAVIR 200 MG PO CAPS
800.0000 mg | ORAL_CAPSULE | Freq: Two times a day (BID) | ORAL | 0 refills | Status: DC
  Filled 2021-04-20: qty 40, 5d supply, fill #0

## 2021-04-19 NOTE — Telephone Encounter (Signed)
Call to advised patient he tested positive for COVID-19 after verifying birthday.  Patient states that he is feeling fatigued and hoarse.  Continues to have a cough.  He has been ill for 3 days now.  Offered to send antiviral medication to pharmacy to help.  Patient has drug interaction with Paxlovid and amiodarone so I am sending molnupiravir.  Advised him also to increase rest and fluids and continue taking cough medicine as needed.  We will put in a referral to the COVID clinic for antibody infusion.  Advised patient to contact his PCP and cardiologist and let them know he tested positive for COVID so that they can be aware.  I did review ED precautions with patient.  Also advised icing for 5 days and then mask for 5 days.  Follow-up with [place] as needed.

## 2021-04-20 ENCOUNTER — Telehealth: Payer: Self-pay | Admitting: Unknown Physician Specialty

## 2021-04-20 ENCOUNTER — Other Ambulatory Visit: Payer: Self-pay

## 2021-04-20 ENCOUNTER — Telehealth: Payer: Self-pay

## 2021-04-20 MED ORDER — MOLNUPIRAVIR EUA 200MG CAPSULE: 4.0000 | ORAL_CAPSULE | Freq: Two times a day (BID) | ORAL | 0 refills | Status: DC

## 2021-04-20 NOTE — Telephone Encounter (Signed)
Called to discuss with patient about COVID-19 symptoms and the use of one of the available treatments for those with mild to moderate Covid symptoms and at a high risk of hospitalization.  Pt appears to qualify for outpatient treatment due to co-morbid conditions and/or a member of an at-risk group in accordance with the FDA Emergency Use Authorization.    Symptom onset: 04/17/21 Cough,fatigue,chest congestion Vaccinated: Yes Booster? Yes Immunocompromised? Yes Qualifiers: CHF,Cardiomyopathy NIH Criteria:   Pt. Would like to speak with APP. Esther Hardy

## 2021-04-20 NOTE — Telephone Encounter (Signed)
Outpatient Oral COVID Treatment Note  I connected with [name] on 04/20/2021/10:30 AM by telephone and verified that I am speaking with the correct person using two identifiers.  I discussed the limitations, risks, security, and privacy concerns of performing an evaluation and management service by telephone and the availability of in person appointments. I also discussed with the patient that there may be a patient responsible charge related to this service. The patient expressed understanding and agreed to proceed.  Patient location: home Provider location: home  Diagnosis: COVID-19 infection  Purpose of visit: Discussion of potential use of Molnupiravir or Paxlovid, a new treatment for mild to moderate COVID-19 viral infection in non-hospitalized patients.   Subjective: Patient is a 75 y.o. male who has been diagnosed with COVID 19 viral infection.  Their symptoms began on 4/29 with nasal congestion  Past Medical History:  Diagnosis Date  . Acute prostatitis   . Balanitis   . Benign enlargement of prostate   . Cardiomyopathy, dilated (HCC)   . Frequency   . HLD (hyperlipidemia)   . HTN (hypertension)   . Hyperlipidemia   . Incomplete bladder emptying   . Nocturia   . Paroxysmal ventricular fibrillation (HCC)   . Phimosis   . Single vessel coronary artery disease   . Systolic CHF (HCC)     No Known Allergies   Current Outpatient Medications:  .  albuterol (VENTOLIN HFA) 108 (90 Base) MCG/ACT inhaler, Inhale into the lungs., Disp: , Rfl:  .  amiodarone (PACERONE) 200 MG tablet, Take 200 mg by mouth daily., Disp: , Rfl:  .  aspirin EC 81 MG tablet, Take 81 mg by mouth daily. Reported on 01/06/2016, Disp: , Rfl:  .  atorvastatin (LIPITOR) 40 MG tablet, Take 40 mg by mouth daily., Disp: , Rfl:  .  betamethasone dipropionate (DIPROLENE) 0.05 % cream, Apply topically 2 (two) times daily., Disp: 30 g, Rfl: 0 .  carvedilol (COREG) 12.5 MG tablet, , Disp: , Rfl:  .  celecoxib  (CELEBREX) 100 MG capsule, TAKE 1 CAPSULE (100 MG TOTAL) BY MOUTH EVERY OTHER DAY, Disp: , Rfl: 2 .  enalapril (VASOTEC) 2.5 MG tablet, Take 2.5 mg by mouth daily., Disp: , Rfl:  .  finasteride (PROSCAR) 5 MG tablet, TAKE 1 TABLET BY MOUTH EVERY DAY, Disp: 90 tablet, Rfl: 2 .  fluticasone (FLONASE) 50 MCG/ACT nasal spray, Place into both nostrils daily., Disp: , Rfl:  .  furosemide (LASIX) 40 MG tablet, Take 40 mg by mouth daily., Disp: , Rfl:  .  guaiFENesin-codeine (ROBITUSSIN AC) 100-10 MG/5ML syrup, Take 5 mLs by mouth 4 (four) times daily as needed for cough., Disp: 120 mL, Rfl: 0 .  Molnupiravir 200 MG CAPS, Take 4 capsules (800 mg total) by mouth 2 (two) times daily for 5 days., Disp: 40 capsule, Rfl: 0 .  potassium chloride SA (KLOR-CON) 20 MEQ tablet, Reported on 05/04/2016, Disp: , Rfl:  .  sertraline (ZOLOFT) 50 MG tablet, TAKE 1.5 TABLETS (75 MG TOTAL) BY MOUTH ONCE DAILY, Disp: , Rfl:  .  spironolactone (ALDACTONE) 12.5 mg TABS tablet, Take 12.5 mg by mouth daily., Disp: , Rfl:  .  tamsulosin (FLOMAX) 0.4 MG CAPS capsule, TAKE 1 CAPSULE BY MOUTH EVERY DAY, Disp: 90 capsule, Rfl: 3 .  Vibegron (GEMTESA) 75 MG TABS, Take 75 mg by mouth daily., Disp: 28 tablet, Rfl: 0 .  Vibegron (GEMTESA) 75 MG TABS, Take 75 mg by mouth daily., Disp: 90 tablet, Rfl: 3  Objective:  Patient appears/sounds congested  They are in no apparent distress.  Breathing is non labored.  Mood and behavior are normal.  Laboratory Data:  Recent Results (from the past 2160 hour(s))  SARS CORONAVIRUS 2 (TAT 6-24 HRS) Nasopharyngeal Nasopharyngeal Swab     Status: Abnormal   Collection Time: 04/18/21 12:47 PM   Specimen: Nasopharyngeal Swab  Result Value Ref Range   SARS Coronavirus 2 POSITIVE (A) NEGATIVE    Comment: (NOTE) SARS-CoV-2 target nucleic acids are DETECTED.  The SARS-CoV-2 RNA is generally detectable in upper and lower respiratory specimens during the acute phase of infection. Positive results are  indicative of the presence of SARS-CoV-2 RNA. Clinical correlation with patient history and other diagnostic information is  necessary to determine patient infection status. Positive results do not rule out bacterial infection or co-infection with other viruses.  The expected result is Negative.  Fact Sheet for Patients: HairSlick.no  Fact Sheet for Healthcare Providers: quierodirigir.com  This test is not yet approved or cleared by the [place] FDA and  has been authorized for detection and/or diagnosis of SARS-CoV-2 by FDA under an Emergency Use Authorization (EUA). This EUA will remain  in effect (meaning this test can be used) for the duration of the COVID-19 declaration under Section 564(b)(1) of the Act, 21 U. S.C. section 360bbb-3(b)(1), unless the authorization is terminated or revoked sooner.   Performed at [facility], [address], [place], [place] [address]      Assessment: 75 y.o. male with mild/moderate COVID 19 viral infection diagnosed on 4/28 at high risk for progression to severe COVID 19.  Plan:  This patient is a 75 y.o. male that meets the following criteria for Emergency Use Authorization of: Molnupiravir  1. Age >18 yr 2. SARS-COV-2 positive test 3. Symptom onset < 5 days 4. Mild-to-moderate COVID disease with high risk for severe progression to hospitalization or death   I have spoken and communicated the following to the patient or parent/caregiver regarding: 1. Molnupiravir is an unapproved drug that is authorized for use under an TEFL teacher.  2. There are no adequate, approved, available products for the treatment of COVID-19 in adults who have mild-to-moderate COVID-19 and are at high risk for progressing to severe COVID-19, including hospitalization or death. 3. Other therapeutics are currently authorized. For additional information on all products authorized for  treatment or prevention of COVID-19, please see https://www.graham-miller.com/.  4. There are benefits and risks of taking this treatment as outlined in the "Fact Sheet for Patients and Caregivers."  5. "Fact Sheet for Patients and Caregivers" was reviewed with patient. A hard copy will be provided to patient from pharmacy prior to the patient receiving treatment. 6. Patients should continue to self-isolate and use infection control measures (e.g., wear mask, isolate, social distance, avoid sharing personal items, clean and disinfect "high touch" surfaces, and frequent handwashing) according to CDC guidelines.  7. The patient or parent/caregiver has the option to accept or refuse treatment. 8. [employer] has established a pregnancy surveillance program. 9. Females of childbearing potential should use a reliable method of contraception correctly and consistently, as applicable, for the duration of treatment and for 4 days after the last dose of Molnupiravir. 10. Males of repro ductive potential who are sexually active with females of childbearing potential should use a reliable method of contraception correctly and consistently during treatment and for at least 3 months after the last dose. 11. Pregnancy status and risk was assessed. Patient  verbalized understanding of precautions.   After reviewing above information with the patient, the patient agrees to receive molnupiravir.  Follow up instructions:    . Take prescription BID x 5 days as directed . Reach out to pharmacist for counseling on medication if desired . For concerns regarding further COVID symptoms please follow up with your PCP or urgent care . For urgent or life-threatening issues, seek care at your local emergency department  The patient was provided an opportunity to ask questions, and all were answered. The patient agreed with  the plan and demonstrated an understanding of the instructions.   Script sent to [facility] The patient was advised to call their PCP or seek an in-person evaluation if the symptoms worsen or if the condition fails to improve as anticipated.   I provided 15 minutes of non face-to-face telephone visit time during this encounter, and > 50% was spent counseling as documented under my assessment & plan.  Gabriel Cirri, NP 04/20/2021 /10:30 AM  Contraindicated for Paxlovid due to Amioderone

## 2021-04-21 ENCOUNTER — Other Ambulatory Visit: Payer: Self-pay | Admitting: Urology

## 2021-04-21 DIAGNOSIS — N401 Enlarged prostate with lower urinary tract symptoms: Secondary | ICD-10-CM

## 2021-04-24 DIAGNOSIS — J208 Acute bronchitis due to other specified organisms: Secondary | ICD-10-CM | POA: Diagnosis not present

## 2021-04-24 DIAGNOSIS — U071 COVID-19: Secondary | ICD-10-CM | POA: Diagnosis not present

## 2021-04-24 DIAGNOSIS — J209 Acute bronchitis, unspecified: Secondary | ICD-10-CM | POA: Diagnosis not present

## 2021-05-15 DIAGNOSIS — Z95810 Presence of automatic (implantable) cardiac defibrillator: Secondary | ICD-10-CM | POA: Diagnosis not present

## 2021-05-15 DIAGNOSIS — I420 Dilated cardiomyopathy: Secondary | ICD-10-CM | POA: Diagnosis not present

## 2021-05-15 DIAGNOSIS — I4901 Ventricular fibrillation: Secondary | ICD-10-CM | POA: Diagnosis not present

## 2021-05-15 DIAGNOSIS — I2510 Atherosclerotic heart disease of native coronary artery without angina pectoris: Secondary | ICD-10-CM | POA: Diagnosis not present

## 2021-05-15 DIAGNOSIS — I5022 Chronic systolic (congestive) heart failure: Secondary | ICD-10-CM | POA: Diagnosis not present

## 2021-05-15 DIAGNOSIS — I10 Essential (primary) hypertension: Secondary | ICD-10-CM | POA: Diagnosis not present

## 2021-05-15 DIAGNOSIS — E7800 Pure hypercholesterolemia, unspecified: Secondary | ICD-10-CM | POA: Diagnosis not present

## 2021-06-04 DIAGNOSIS — F3341 Major depressive disorder, recurrent, in partial remission: Secondary | ICD-10-CM | POA: Diagnosis not present

## 2021-06-04 DIAGNOSIS — D649 Anemia, unspecified: Secondary | ICD-10-CM | POA: Diagnosis not present

## 2021-06-04 DIAGNOSIS — I10 Essential (primary) hypertension: Secondary | ICD-10-CM | POA: Diagnosis not present

## 2021-06-04 DIAGNOSIS — R7301 Impaired fasting glucose: Secondary | ICD-10-CM | POA: Diagnosis not present

## 2021-06-04 DIAGNOSIS — I420 Dilated cardiomyopathy: Secondary | ICD-10-CM | POA: Diagnosis not present

## 2021-06-04 DIAGNOSIS — N401 Enlarged prostate with lower urinary tract symptoms: Secondary | ICD-10-CM | POA: Diagnosis not present

## 2021-06-04 DIAGNOSIS — I4901 Ventricular fibrillation: Secondary | ICD-10-CM | POA: Diagnosis not present

## 2021-06-04 DIAGNOSIS — F419 Anxiety disorder, unspecified: Secondary | ICD-10-CM | POA: Diagnosis not present

## 2021-06-04 DIAGNOSIS — E7800 Pure hypercholesterolemia, unspecified: Secondary | ICD-10-CM | POA: Diagnosis not present

## 2021-06-09 DIAGNOSIS — I5022 Chronic systolic (congestive) heart failure: Secondary | ICD-10-CM | POA: Diagnosis not present

## 2021-08-05 ENCOUNTER — Other Ambulatory Visit: Payer: Self-pay

## 2021-08-07 ENCOUNTER — Other Ambulatory Visit: Payer: Self-pay | Admitting: *Deleted

## 2021-08-07 ENCOUNTER — Ambulatory Visit: Payer: Self-pay | Admitting: Urology

## 2021-08-07 DIAGNOSIS — N138 Other obstructive and reflux uropathy: Secondary | ICD-10-CM

## 2021-08-07 DIAGNOSIS — N401 Enlarged prostate with lower urinary tract symptoms: Secondary | ICD-10-CM

## 2021-08-07 NOTE — Progress Notes (Signed)
9:08 AM   [name] 1946-11-07 [id number]  Referring provider: Sofie Hartigan, MD [facility] [place],  [place] [address]  Urological history: 1. BPH with LU TS -PSA pending -cysto 05/30/2019 NED -I PSS 12/1 -PVR 59 mL - managed with tamsulosin 0.4 mg daily and finasteride 5 mg daily  2. Nocturia -contributing factors of age, hypertension, heart disease and BPH   3. ED -contributing factors of age, hypertension, heart disease, BPH and former smoker -SHIM 6 -not currently sexually active   4. Urethral strictures -remote history of urethral dilations -cysto 2020 NED  5. BXO -Underwent circumcision in 2017     Chief Complaint  Patient presents with   Follow-up    1 year follow-up     HPI: [name] is a 75 y.o. male who presents today for a yearly visit.  Nocturia x 1-2.    Patient denies any modifying or aggravating factors.  Patient denies any gross hematuria, dysuria or suprapubic/flank pain.  Patient denies any fevers, chills, nausea or vomiting.     IPSS     Row Name 08/10/21 0800         International Prostate Symptom Score   How often have you had the sensation of not emptying your bladder? Less than 1 in 5     How often have you had to urinate less than every two hours? Less than 1 in 5 times     How often have you found you stopped and started again several times when you urinated? Less than half the time     How often have you found it difficult to postpone urination? Less than 1 in 5 times     How often have you had a weak urinary stream? About half the time     How often have you had to strain to start urination? Less than half the time     How many times did you typically get up at night to urinate? 2 Times     Total IPSS Score 12           Quality of Life due to urinary symptoms   If you were to spend the rest of your life with your urinary condition just the way it is now how would you feel about that? Pleased                Score:  1-7 Mild 8-19 Moderate 20-35 Severe  Patient is not having spontaneous erections.   He denies any pain or curvature with erections.  Wife is not an interested partner.    SHIM     Row Name 08/10/21 0856         SHIM: Over the last 6 months:   How do you rate your confidence that you could get and keep an erection? Low     When you had erections with sexual stimulation, how often were your erections hard enough for penetration (entering your partner)? Almost Never or Never     During sexual intercourse, how often were you able to maintain your erection after you had penetrated (entered) your partner? Almost Never or Never     During sexual intercourse, how difficult was it to maintain your erection to completion of intercourse? Extremely Difficult     When you attempted sexual intercourse, how often was it satisfactory for you? Almost Never or Never           SHIM Total Score  SHIM 6              PMH: Past Medical History:  Diagnosis Date   Acute prostatitis    Balanitis    Benign enlargement of prostate    Cardiomyopathy, dilated ([place])    Frequency    HLD (hyperlipidemia)    HTN (hypertension)    Hyperlipidemia    Incomplete bladder emptying    Nocturia    Paroxysmal ventricular fibrillation (HCC)    Phimosis    Single vessel coronary artery disease    Systolic CHF [facility])     Surgical History: Past Surgical History:  Procedure Laterality Date   defribrillator     Implant   REPLACEMENT TOTAL KNEE     SHOULDER ARTHROSCOPY WITH OPEN ROTATOR CUFF REPAIR Right 08/06/2015   Procedure: SHOULDER ARTHROSCOPY WITH MINI OPEN ROTATOR CUFF REPAIR, SUBACROMIAL DECOPRESSION, RELEASE LONGHEAD BICEP TENDON ;  Surgeon: Leanor Kail, MD;  Location: [facility] ORS;  Service: Orthopedics;  Laterality: Right;    Home Medications:  Allergies as of 08/10/2021   No Known Allergies      Medication List        Accurate as of August 10, 2021  9:08 AM.  If you have any questions, ask your nurse or doctor.          STOP taking these medications    betamethasone dipropionate 0.05 % cream Stopped by: Kalei Mckillop, PA-C   Gemtesa 75 MG Tabs Generic drug: Vibegron Stopped by: Zara Council, PA-C       TAKE these medications    albuterol 108 (90 Base) MCG/ACT inhaler Commonly known as: VENTOLIN HFA Inhale into the lungs.   amiodarone 200 MG tablet Commonly known as: PACERONE Take 200 mg by mouth daily.   aspirin EC 81 MG tablet Take 81 mg by mouth daily. Reported on 01/06/2016   atorvastatin 40 MG tablet Commonly known as: LIPITOR Take 40 mg by mouth daily.   carvedilol 12.5 MG tablet Commonly known as: COREG   celecoxib 100 MG capsule Commonly known as: CELEBREX TAKE 1 CAPSULE (100 MG TOTAL) BY MOUTH EVERY OTHER DAY   enalapril 2.5 MG tablet Commonly known as: VASOTEC Take 2.5 mg by mouth daily.   finasteride 5 MG tablet Commonly known as: PROSCAR TAKE 1 TABLET BY MOUTH EVERY DAY   fluticasone 50 MCG/ACT nasal spray Commonly known as: FLONASE Place into both nostrils daily.   furosemide 40 MG tablet Commonly known as: LASIX Take 40 mg by mouth daily.   guaiFENesin-codeine 100-10 MG/5ML syrup Commonly known as: ROBITUSSIN AC Take 5 mLs by mouth 4 (four) times daily as needed for cough.   potassium chloride SA 20 MEQ tablet Commonly known as: KLOR-CON Reported on 05/04/2016   sertraline 50 MG tablet Commonly known as: ZOLOFT TAKE 1.5 TABLETS (75 MG TOTAL) BY MOUTH ONCE DAILY   spironolactone 12.5 mg Tabs tablet Commonly known as: ALDACTONE Take 12.5 mg by mouth daily.   tamsulosin 0.4 MG Caps capsule Commonly known as: FLOMAX TAKE 1 CAPSULE BY MOUTH EVERY DAY        Allergies: No Known Allergies  Family History: Family History  Problem Relation Age of Onset   Kidney disease Mother    Bladder Cancer Mother    Prostate cancer Neg Hx    Kidney cancer Neg Hx     Social History:   reports that he quit smoking about 14 years ago. His smoking use included cigarettes. He has never used smokeless tobacco. He reports that  he does not drink alcohol and does not use drugs.  ROS: For pertinent review of systems please refer to history of present illness  Physical Exam: BP 125/73   Pulse 80   Ht _0  (1.626 m)   Wt 165 lb (74.8 kg)   BMI 28.32 kg/m   Constitutional:  Well nourished. Alert and oriented, No acute distress. HEENT: [place] AT, mask in place  Trachea midline Cardiovascular: No clubbing, cyanosis, or edema. Respiratory: Normal respiratory effort, no increased work of breathing. GU: No CVA tenderness.  No bladder fullness or masses.  Patient with circumcised phallus.   Urethral meatus is patent.  No penile discharge. No penile lesions or rashes. Scrotum without lesions, cysts, rashes and/or edema.  Testicles are located scrotally bilaterally. No masses are appreciated in the testicles. Left and right epididymis are normal. Rectal: Patient with  normal sphincter tone. Anus and perineum without scarring or rashes. No rectal masses are appreciated. Prostate is approximately 50 grams, no nodules are appreciated. Seminal vesicles could not be palpated Neurologic: Grossly intact, no focal deficits, moving all 4 extremities. Psychiatric: Normal mood and affect.   Laboratory Data: Glucose 70 - 110 mg/dL 102   Sodium 136 - 145 mmol/L 139   Potassium 3.6 - 5.1 mmol/L 4.3   Chloride 97 - 109 mmol/L 107   Carbon Dioxide (CO2) 22.0 - 32.0 mmol/L 27.3   Urea Nitrogen (BUN) 7 - 25 mg/dL 18   Creatinine 0.7 - 1.3 mg/dL 0.9   Glomerular Filtration Rate (eGFR), MDRD Estimate >60 mL/min/1.73sq m 83   Calcium 8.7 - 10.3 mg/dL 9.2   AST  8 - 39 U/L 23   ALT  6 - 57 U/L 24   Alk Phos (alkaline Phosphatase) 34 - 104 U/L 103   Albumin 3.5 - 4.8 g/dL 4.4   Bilirubin, Total 0.3 - 1.2 mg/dL 0.6   Protein, Total 6.1 - 7.9 g/dL 6.8   A/G Ratio 1.0 - 5.0 gm/dL 1.8   Resulting Agency   [facility]  Specimen Collected: 06/04/21 09:02 Last Resulted: 06/04/21 15:54  Received From: [facility]  Result Received: 08/07/21 07:48   Cholesterol, Total 100 - 200 mg/dL 124   Triglyceride 35 - 199 mg/dL 51   HDL (High Density Lipoprotein) Cholesterol 29.0 - 71.0 mg/dL 51.7   LDL Calculated 0 - 130 mg/dL 62   VLDL Cholesterol mg/dL 10   Cholesterol/HDL Ratio  2.4   Resulting Agency  [facility]  Specimen Collected: 06/04/21 09:02 Last Resulted: 06/04/21 15:54  Received From: [facility]  Result Received: 08/07/21 07:48   Hemoglobin A1C 4.2 - 5.6 % 5.8 High    Average Blood Glucose (Calc) mg/dL 120   Resulting [facility]  Narrative Performed by [facility] Normal Range:    4.2 - 5.6%  Increased Risk:  5.7 - 6.4%  Diabetes:        >= 6.5%  Glycemic Control for adults with diabetes:  <7%   Specimen Collected: 06/04/21 09:02 Last Resulted: 06/04/21 12:00  Received From: [facility]  Result Received: 08/07/21 07:48   WBC (White Blood Cell Count) 4.1 - 10.2 10^3/uL 4.4   RBC (Red Blood Cell Count) 4.69 - 6.13 10^6/uL 3.98 Low    Hemoglobin 14.1 - 18.1 gm/dL 13.3 Low    Hematocrit 40.0 - 52.0 % 39.7 Low    MCV (Mean Corpuscular Volume) 80.0 -  100.0 fl 99.7   MCH (Mean Corpuscular Hemoglobin) 27.0 - 31.2 pg 33.4 High    MCHC (Mean Corpuscular Hemoglobin Concentration) 32.0 - 36.0 gm/dL 33.5   Platelet Count 150 - 450 10^3/uL 239   RDW-CV (Red Cell Distribution Width) 11.6 - 14.8 % 12.7   MPV (Mean Platelet Volume) 9.4 - 12.4 fl 9.3 Low    Neutrophils 1.50 - 7.80 10^3/uL 3.40   Lymphocytes 1.00 - 3.60 10^3/uL 0.60 Low    Mixed Count 0.10 - 0.90 10^3/uL 0.40   Neutrophil % 32.0 - 70.0 % 77.9 High    Lymphocyte % 10.0 - 50.0 % 12.5   Mixed % 3.0 - 14.4 % 9.6   Resulting Agency  [facility]  Specimen Collected: 06/04/21 09:02 Last Resulted:  06/04/21 09:59  Received From: [facility]  Result Received: 08/07/21 07:48  I have reviewed the labs.  Pertinent Imaging Results for Kenneth, House (MRN [id number]) as of 08/10/2021 09:04  Ref. Range 08/10/2021 08:51  Scan Result Unknown 45m    Assessment & Plan:    1. BPH with LUTS -PSA pending -DRE benign -Continue tamsulosin 0.4 mg and finasteride 5 mg daily  2. Nocturia -GLogan Boreswas cost prohibitive so he is discontinued -at most twice nightly -Not bothersome -Manages with restricting fluids in the evening  Return in about 1 year (around 08/10/2022) for IPSS, PSA and exam.  SZara Council [facility] [place] [phone]

## 2021-08-10 ENCOUNTER — Encounter: Payer: Self-pay | Admitting: Urology

## 2021-08-10 ENCOUNTER — Other Ambulatory Visit
Admission: RE | Admit: 2021-08-10 | Discharge: 2021-08-10 | Disposition: A | Discharge status: Home / Self Care | Payer: Medicare HMO | Attending: Urology | Admitting: Urology

## 2021-08-10 ENCOUNTER — Ambulatory Visit: Payer: Medicare HMO | Admitting: Urology

## 2021-08-10 ENCOUNTER — Other Ambulatory Visit: Payer: Self-pay

## 2021-08-10 VITALS — BP 125/73 | HR 80 | Ht 64.0 in | Wt 165.0 lb

## 2021-08-10 DIAGNOSIS — R351 Nocturia: Secondary | ICD-10-CM | POA: Diagnosis not present

## 2021-08-10 DIAGNOSIS — N138 Other obstructive and reflux uropathy: Secondary | ICD-10-CM | POA: Insufficient documentation

## 2021-08-10 DIAGNOSIS — N401 Enlarged prostate with lower urinary tract symptoms: Secondary | ICD-10-CM | POA: Insufficient documentation

## 2021-08-10 LAB — BLADDER SCAN AMB NON-IMAGING

## 2021-08-10 LAB — PSA: Prostatic Specific Antigen: 0.18 ng/mL (ref 0.00–4.00)

## 2021-08-10 NOTE — Addendum Note (Signed)
Addended by: Caroline Sauger on: 08/10/2021 08:32 AM   Modules accepted: Orders

## 2021-09-08 DIAGNOSIS — I5022 Chronic systolic (congestive) heart failure: Secondary | ICD-10-CM | POA: Diagnosis not present

## 2021-11-19 DIAGNOSIS — R0602 Shortness of breath: Secondary | ICD-10-CM | POA: Diagnosis not present

## 2021-11-19 DIAGNOSIS — R0989 Other specified symptoms and signs involving the circulatory and respiratory systems: Secondary | ICD-10-CM | POA: Diagnosis not present

## 2021-11-19 DIAGNOSIS — Z03818 Encounter for observation for suspected exposure to other biological agents ruled out: Secondary | ICD-10-CM | POA: Diagnosis not present

## 2021-11-19 DIAGNOSIS — J069 Acute upper respiratory infection, unspecified: Secondary | ICD-10-CM | POA: Diagnosis not present

## 2021-11-19 DIAGNOSIS — J209 Acute bronchitis, unspecified: Secondary | ICD-10-CM | POA: Diagnosis not present

## 2021-11-19 DIAGNOSIS — R062 Wheezing: Secondary | ICD-10-CM | POA: Diagnosis not present

## 2021-12-18 DIAGNOSIS — N401 Enlarged prostate with lower urinary tract symptoms: Secondary | ICD-10-CM | POA: Diagnosis not present

## 2021-12-18 DIAGNOSIS — I4901 Ventricular fibrillation: Secondary | ICD-10-CM | POA: Diagnosis not present

## 2021-12-18 DIAGNOSIS — F3341 Major depressive disorder, recurrent, in partial remission: Secondary | ICD-10-CM | POA: Diagnosis not present

## 2021-12-18 DIAGNOSIS — E7800 Pure hypercholesterolemia, unspecified: Secondary | ICD-10-CM | POA: Diagnosis not present

## 2021-12-18 DIAGNOSIS — D649 Anemia, unspecified: Secondary | ICD-10-CM | POA: Diagnosis not present

## 2021-12-18 DIAGNOSIS — Z0000 Encounter for general adult medical examination without abnormal findings: Secondary | ICD-10-CM | POA: Diagnosis not present

## 2021-12-18 DIAGNOSIS — F419 Anxiety disorder, unspecified: Secondary | ICD-10-CM | POA: Diagnosis not present

## 2021-12-18 DIAGNOSIS — R7302 Impaired glucose tolerance (oral): Secondary | ICD-10-CM | POA: Diagnosis not present

## 2021-12-18 DIAGNOSIS — Z1389 Encounter for screening for other disorder: Secondary | ICD-10-CM | POA: Diagnosis not present

## 2021-12-18 DIAGNOSIS — I10 Essential (primary) hypertension: Secondary | ICD-10-CM | POA: Diagnosis not present

## 2021-12-18 DIAGNOSIS — I420 Dilated cardiomyopathy: Secondary | ICD-10-CM | POA: Diagnosis not present

## 2022-01-11 DIAGNOSIS — I4901 Ventricular fibrillation: Secondary | ICD-10-CM | POA: Diagnosis not present

## 2022-01-26 DIAGNOSIS — U071 COVID-19: Secondary | ICD-10-CM | POA: Diagnosis not present

## 2022-01-26 DIAGNOSIS — Z95810 Presence of automatic (implantable) cardiac defibrillator: Secondary | ICD-10-CM | POA: Diagnosis not present

## 2022-01-26 DIAGNOSIS — I420 Dilated cardiomyopathy: Secondary | ICD-10-CM | POA: Diagnosis not present

## 2022-01-26 DIAGNOSIS — I4901 Ventricular fibrillation: Secondary | ICD-10-CM | POA: Diagnosis not present

## 2022-01-26 DIAGNOSIS — J208 Acute bronchitis due to other specified organisms: Secondary | ICD-10-CM | POA: Diagnosis not present

## 2022-01-26 DIAGNOSIS — I10 Essential (primary) hypertension: Secondary | ICD-10-CM | POA: Diagnosis not present

## 2022-01-26 DIAGNOSIS — I5022 Chronic systolic (congestive) heart failure: Secondary | ICD-10-CM | POA: Diagnosis not present

## 2022-01-26 DIAGNOSIS — I2510 Atherosclerotic heart disease of native coronary artery without angina pectoris: Secondary | ICD-10-CM | POA: Diagnosis not present

## 2022-03-10 DIAGNOSIS — Z03818 Encounter for observation for suspected exposure to other biological agents ruled out: Secondary | ICD-10-CM | POA: Diagnosis not present

## 2022-03-10 DIAGNOSIS — J0110 Acute frontal sinusitis, unspecified: Secondary | ICD-10-CM | POA: Diagnosis not present

## 2022-03-18 ENCOUNTER — Other Ambulatory Visit: Payer: Self-pay | Admitting: Urology

## 2022-03-18 DIAGNOSIS — N138 Other obstructive and reflux uropathy: Secondary | ICD-10-CM

## 2022-03-20 ENCOUNTER — Other Ambulatory Visit: Payer: Self-pay | Admitting: Urology

## 2022-03-20 DIAGNOSIS — N401 Enlarged prostate with lower urinary tract symptoms: Secondary | ICD-10-CM

## 2022-04-29 DIAGNOSIS — I5022 Chronic systolic (congestive) heart failure: Secondary | ICD-10-CM | POA: Diagnosis not present

## 2022-04-29 DIAGNOSIS — E7800 Pure hypercholesterolemia, unspecified: Secondary | ICD-10-CM | POA: Diagnosis not present

## 2022-04-29 DIAGNOSIS — I420 Dilated cardiomyopathy: Secondary | ICD-10-CM | POA: Diagnosis not present

## 2022-04-29 DIAGNOSIS — I10 Essential (primary) hypertension: Secondary | ICD-10-CM | POA: Diagnosis not present

## 2022-04-29 DIAGNOSIS — Z95810 Presence of automatic (implantable) cardiac defibrillator: Secondary | ICD-10-CM | POA: Diagnosis not present

## 2022-04-29 DIAGNOSIS — I4901 Ventricular fibrillation: Secondary | ICD-10-CM | POA: Diagnosis not present

## 2022-05-18 DIAGNOSIS — J069 Acute upper respiratory infection, unspecified: Secondary | ICD-10-CM | POA: Diagnosis not present

## 2022-05-18 DIAGNOSIS — J209 Acute bronchitis, unspecified: Secondary | ICD-10-CM | POA: Diagnosis not present

## 2022-07-27 DIAGNOSIS — I420 Dilated cardiomyopathy: Secondary | ICD-10-CM | POA: Diagnosis not present

## 2022-08-03 DIAGNOSIS — I10 Essential (primary) hypertension: Secondary | ICD-10-CM | POA: Diagnosis not present

## 2022-08-03 DIAGNOSIS — I4901 Ventricular fibrillation: Secondary | ICD-10-CM | POA: Diagnosis not present

## 2022-08-03 DIAGNOSIS — I420 Dilated cardiomyopathy: Secondary | ICD-10-CM | POA: Diagnosis not present

## 2022-08-03 DIAGNOSIS — I2510 Atherosclerotic heart disease of native coronary artery without angina pectoris: Secondary | ICD-10-CM | POA: Diagnosis not present

## 2022-08-03 DIAGNOSIS — E7800 Pure hypercholesterolemia, unspecified: Secondary | ICD-10-CM | POA: Diagnosis not present

## 2022-08-03 DIAGNOSIS — I5022 Chronic systolic (congestive) heart failure: Secondary | ICD-10-CM | POA: Diagnosis not present

## 2022-08-03 DIAGNOSIS — Z95810 Presence of automatic (implantable) cardiac defibrillator: Secondary | ICD-10-CM | POA: Diagnosis not present

## 2022-08-09 ENCOUNTER — Ambulatory Visit: Payer: Medicare HMO | Admitting: Urology

## 2022-08-29 NOTE — Progress Notes (Signed)
3:39 PM   [name] [birth year] [id number]  Referring provider: Sofie Hartigan, MD [address],  [place] [address]  Urological history: 1. BPH with LU TS -cysto 05/30/2019 NED -I PSS 3/0 -tamsulosin 0.4 mg daily and finasteride 5 mg daily  2. Nocturia -contributing factors of age, hypertension, heart disease and BPH   3. ED -contributing factors of age, hypertension, heart disease, BPH and former smoker -not currently sexually active   4. Urethral strictures -remote history of urethral dilations -cysto 2020 NED  5. BXO -Underwent circumcision in 2017   Chief Complaint  Patient presents with   Benign Prostatic Hypertrophy   HPI: [name] is a 76 y.o. male who presents today for a yearly visit.  He has no urinary complaints at this time.  Patient denies any modifying or aggravating factors.  Patient denies any gross hematuria, dysuria or suprapubic/flank pain.  Patient denies any fevers, chills, nausea or vomiting.     IPSS     Row Name 08/30/22 1500         International Prostate Symptom Score   How often have you had the sensation of not emptying your bladder? Not at All     How often have you had to urinate less than every two hours? Less than 1 in 5 times     How often have you found you stopped and started again several times when you urinated? Not at All     How often have you found it difficult to postpone urination? Not at All     How often have you had a weak urinary stream? Not at All     How often have you had to strain to start urination? Not at All     How many times did you typically get up at night to urinate? 2 Times     Total IPSS Score 3       Quality of Life due to urinary symptoms   If you were to spend the rest of your life with your urinary condition just the way it is now how would you feel about that? Delighted                Score:  1-7 Mild 8-19 Moderate 20-35 Severe    PMH: Past Medical  History:  Diagnosis Date   Acute prostatitis    Balanitis    Benign enlargement of prostate    Cardiomyopathy, dilated (HCC)    Frequency    HLD (hyperlipidemia)    HTN (hypertension)    Hyperlipidemia    Incomplete bladder emptying    Nocturia    Paroxysmal ventricular fibrillation (HCC)    Phimosis    Single vessel coronary artery disease    Systolic CHF [facility])     Surgical History: Past Surgical History:  Procedure Laterality Date   defribrillator     Implant   REPLACEMENT TOTAL KNEE     SHOULDER ARTHROSCOPY WITH OPEN ROTATOR CUFF REPAIR Right 08/06/2015   Procedure: SHOULDER ARTHROSCOPY WITH MINI OPEN ROTATOR CUFF REPAIR, SUBACROMIAL DECOPRESSION, RELEASE LONGHEAD BICEP TENDON ;  Surgeon: Leanor Kail, MD;  Location: [facility] ORS;  Service: Orthopedics;  Laterality: Right;    Home Medications:  Allergies as of 08/30/2022   No Known Allergies      Medication List        Accurate as of August 30, 2022  3:39 PM. If you have any questions, ask your nurse or  doctor.          STOP taking these medications    guaiFENesin-codeine 100-10 MG/5ML syrup Commonly known as: ROBITUSSIN AC Stopped by: Jazlynn Nemetz, PA-C       TAKE these medications    albuterol 108 (90 Base) MCG/ACT inhaler Commonly known as: VENTOLIN HFA Inhale into the lungs.   amiodarone 200 MG tablet Commonly known as: PACERONE Take 200 mg by mouth daily.   aspirin EC 81 MG tablet Take 81 mg by mouth daily. Reported on 01/06/2016   atorvastatin 40 MG tablet Commonly known as: LIPITOR Take 40 mg by mouth daily.   carvedilol 12.5 MG tablet Commonly known as: COREG   celecoxib 100 MG capsule Commonly known as: CELEBREX TAKE 1 CAPSULE (100 MG TOTAL) BY MOUTH EVERY OTHER DAY   enalapril 2.5 MG tablet Commonly known as: VASOTEC Take 2.5 mg by mouth daily.   finasteride 5 MG tablet Commonly known as: PROSCAR TAKE 1 TABLET BY MOUTH EVERY DAY   fluticasone 50 MCG/ACT nasal  spray Commonly known as: FLONASE Place into both nostrils daily.   furosemide 40 MG tablet Commonly known as: LASIX Take 40 mg by mouth daily.   potassium chloride SA 20 MEQ tablet Commonly known as: KLOR-CON M Reported on 05/04/2016   sertraline 100 MG tablet Commonly known as: ZOLOFT Take 150 mg by mouth daily. What changed: Another medication with the same name was removed. Continue taking this medication, and follow the directions you see here. Changed by: Zara Council, PA-C   spironolactone 12.5 mg Tabs tablet Commonly known as: ALDACTONE Take 12.5 mg by mouth daily.   tamsulosin 0.4 MG Caps capsule Commonly known as: FLOMAX TAKE 1 CAPSULE BY MOUTH EVERY DAY        Allergies: No Known Allergies  Family History: Family History  Problem Relation Age of Onset   Kidney disease Mother    Bladder Cancer Mother    Prostate cancer Neg Hx    Kidney cancer Neg Hx     Social History:  reports that he quit smoking about 15 years ago. His smoking use included cigarettes. He has never used smokeless tobacco. He reports that he does not drink alcohol and does not use drugs.  ROS: For pertinent review of systems please refer to history of present illness  Physical Exam: BP 128/72   Pulse 80   Ht 5' 4"  (1.626 m)   Wt 164 lb (74.4 kg)   BMI 28.15 kg/m   Constitutional:  Well nourished. Alert and oriented, No acute distress. HEENT: [place] AT, moist mucus membranes.  Trachea midline Cardiovascular: No clubbing, cyanosis, or edema. Respiratory: Normal respiratory effort, no increased work of breathing. Neurologic: Grossly intact, no focal deficits, moving all 4 extremities. Psychiatric: Normal mood and affect.   Laboratory Data: WBC (White Blood Cell Count) 4.1 - 10.2 10^3/uL 4.4   RBC (Red Blood Cell Count) 4.69 - 6.13 10^6/uL 3.96 Low    Hemoglobin 14.1 - 18.1 gm/dL 13.1 Low    Hematocrit 40.0 - 52.0 % 39.6 Low    MCV (Mean Corpuscular Volume) 80.0 - 100.0 fl 100.0    MCH (Mean Corpuscular Hemoglobin) 27.0 - 31.2 pg 33.1 High    MCHC (Mean Corpuscular Hemoglobin Concentration) 32.0 - 36.0 gm/dL 33.1   Platelet Count 150 - 450 10^3/uL 191   RDW-CV (Red Cell Distribution Width) 11.6 - 14.8 % 12.8   MPV (Mean Platelet Volume) 9.4 - 12.4 fl 9.3 Low    Neutrophils 1.50 -  7.80 10^3/uL 3.40   Lymphocytes 1.00 - 3.60 10^3/uL 0.40 Low    Mixed Count 0.10 - 0.90 10^3/uL 0.60   Neutrophil % 32.0 - 70.0 % 75.4 High    Lymphocyte % 10.0 - 50.0 % 10.0   Mixed % 3.0 - 14.4 % 14.6 High    Resulting Agency  [facility] - LAB   Specimen Collected: 12/18/21 09:13 Last Resulted: 12/18/21 10:36  Received From: [facility]  Result Received: 03/18/22 07:48   Hemoglobin A1C 4.2 - 5.6 % 5.8 High    Average Blood Glucose (Calc) mg/dL 120   Resulting [facility] - LAB  Narrative Performed by [facility] - LAB Normal Range:    4.2 - 5.6%  Increased Risk:  5.7 - 6.4%  Diabetes:        >= 6.5%  Glycemic Control for adults with diabetes:  <7%    Specimen Collected: 12/18/21 09:13 Last Resulted: 12/18/21 13:55  Received From: [facility]  Result Received: 03/18/22 07:48   Glucose 70 - 110 mg/dL 103   Sodium 136 - 145 mmol/L 140   Potassium 3.6 - 5.1 mmol/L 4.5   Chloride 97 - 109 mmol/L 107   Carbon Dioxide (CO2) 22.0 - 32.0 mmol/L 25.8   Urea Nitrogen (BUN) 7 - 25 mg/dL 28 High    Creatinine 0.7 - 1.3 mg/dL 1.2   Glomerular Filtration Rate (eGFR), MDRD Estimate >60 mL/min/1.73sq m 59 Low    Calcium 8.7 - 10.3 mg/dL 9.0   AST  8 - 39 U/L 20   ALT  6 - 57 U/L 22   Alk Phos (alkaline Phosphatase) 34 - 104 U/L 95   Albumin 3.5 - 4.8 g/dL 4.3   Bilirubin, Total 0.3 - 1.2 mg/dL 0.7   Protein, Total 6.1 - 7.9 g/dL 6.7   A/G Ratio 1.0 - 5.0 gm/dL 1.8   Resulting Agency  [facility] - [facility]   Specimen Collected: 12/18/21 09:13 Last Resulted: 12/18/21 13:40  Received From: [facility]  Result Received: 03/18/22 07:48  I have reviewed the labs.  Pertinent Imaging N/A  Assessment & Plan:    1. BPH with LUTS -Continue tamsulosin 0.4 mg and finasteride 5 mg daily  2. Nocturia -Logan Bores was cost prohibitive so he is discontinued -at most twice nightly -Not bothersome -Manages with restricting fluids in the evening  3. Prostate cancer screening -Discussed the AUA Guideline's (2023) for men aged 75+ years with PSA < 3 ng/mL may choose discontinue screening  -he has chosen to discontinue screening   Return in about 1 year (around 08/31/2023) for I PSS and PVR .  [place], [place] [address] [place], [place] [address] [phone]

## 2022-08-30 ENCOUNTER — Ambulatory Visit: Payer: Medicare HMO | Admitting: Urology

## 2022-08-30 ENCOUNTER — Encounter: Payer: Self-pay | Admitting: Urology

## 2022-08-30 VITALS — BP 128/72 | HR 80 | Ht 64.0 in | Wt 164.0 lb

## 2022-08-30 DIAGNOSIS — N401 Enlarged prostate with lower urinary tract symptoms: Secondary | ICD-10-CM

## 2022-08-30 DIAGNOSIS — N138 Other obstructive and reflux uropathy: Secondary | ICD-10-CM

## 2022-08-30 DIAGNOSIS — R351 Nocturia: Secondary | ICD-10-CM

## 2022-08-30 DIAGNOSIS — Z125 Encounter for screening for malignant neoplasm of prostate: Secondary | ICD-10-CM

## 2022-09-13 ENCOUNTER — Other Ambulatory Visit: Payer: Self-pay | Admitting: Urology

## 2022-09-13 DIAGNOSIS — N138 Other obstructive and reflux uropathy: Secondary | ICD-10-CM

## 2022-10-15 DIAGNOSIS — F3341 Major depressive disorder, recurrent, in partial remission: Secondary | ICD-10-CM | POA: Diagnosis not present

## 2022-10-15 DIAGNOSIS — Z0000 Encounter for general adult medical examination without abnormal findings: Secondary | ICD-10-CM | POA: Diagnosis not present

## 2022-10-15 DIAGNOSIS — N401 Enlarged prostate with lower urinary tract symptoms: Secondary | ICD-10-CM | POA: Diagnosis not present

## 2022-10-15 DIAGNOSIS — E7800 Pure hypercholesterolemia, unspecified: Secondary | ICD-10-CM | POA: Diagnosis not present

## 2022-10-15 DIAGNOSIS — F419 Anxiety disorder, unspecified: Secondary | ICD-10-CM | POA: Diagnosis not present

## 2022-10-15 DIAGNOSIS — R7302 Impaired glucose tolerance (oral): Secondary | ICD-10-CM | POA: Diagnosis not present

## 2022-10-15 DIAGNOSIS — D649 Anemia, unspecified: Secondary | ICD-10-CM | POA: Diagnosis not present

## 2022-10-15 DIAGNOSIS — Z8679 Personal history of other diseases of the circulatory system: Secondary | ICD-10-CM | POA: Diagnosis not present

## 2022-10-15 DIAGNOSIS — I10 Essential (primary) hypertension: Secondary | ICD-10-CM | POA: Diagnosis not present

## 2022-10-15 DIAGNOSIS — I420 Dilated cardiomyopathy: Secondary | ICD-10-CM | POA: Diagnosis not present

## 2022-10-20 DIAGNOSIS — I480 Paroxysmal atrial fibrillation: Secondary | ICD-10-CM | POA: Diagnosis not present

## 2022-11-03 DIAGNOSIS — I10 Essential (primary) hypertension: Secondary | ICD-10-CM | POA: Diagnosis not present

## 2022-11-03 DIAGNOSIS — Z95810 Presence of automatic (implantable) cardiac defibrillator: Secondary | ICD-10-CM | POA: Diagnosis not present

## 2022-11-03 DIAGNOSIS — E7800 Pure hypercholesterolemia, unspecified: Secondary | ICD-10-CM | POA: Diagnosis not present

## 2022-11-03 DIAGNOSIS — I4901 Ventricular fibrillation: Secondary | ICD-10-CM | POA: Diagnosis not present

## 2022-11-03 DIAGNOSIS — I5022 Chronic systolic (congestive) heart failure: Secondary | ICD-10-CM | POA: Diagnosis not present

## 2022-11-03 DIAGNOSIS — I420 Dilated cardiomyopathy: Secondary | ICD-10-CM | POA: Diagnosis not present

## 2022-11-10 DIAGNOSIS — Z1211 Encounter for screening for malignant neoplasm of colon: Secondary | ICD-10-CM | POA: Diagnosis not present

## 2022-12-12 ENCOUNTER — Other Ambulatory Visit: Payer: Self-pay | Admitting: Urology

## 2022-12-12 DIAGNOSIS — N401 Enlarged prostate with lower urinary tract symptoms: Secondary | ICD-10-CM

## 2023-01-12 DIAGNOSIS — U071 COVID-19: Secondary | ICD-10-CM | POA: Diagnosis not present

## 2023-01-25 DIAGNOSIS — I4901 Ventricular fibrillation: Secondary | ICD-10-CM | POA: Diagnosis not present

## 2023-02-01 DIAGNOSIS — E7800 Pure hypercholesterolemia, unspecified: Secondary | ICD-10-CM | POA: Diagnosis not present

## 2023-02-01 DIAGNOSIS — I420 Dilated cardiomyopathy: Secondary | ICD-10-CM | POA: Diagnosis not present

## 2023-02-01 DIAGNOSIS — Z01818 Encounter for other preprocedural examination: Secondary | ICD-10-CM | POA: Diagnosis not present

## 2023-02-01 DIAGNOSIS — Z95810 Presence of automatic (implantable) cardiac defibrillator: Secondary | ICD-10-CM | POA: Diagnosis not present

## 2023-02-01 DIAGNOSIS — I2510 Atherosclerotic heart disease of native coronary artery without angina pectoris: Secondary | ICD-10-CM | POA: Diagnosis not present

## 2023-02-01 DIAGNOSIS — I4901 Ventricular fibrillation: Secondary | ICD-10-CM | POA: Diagnosis not present

## 2023-02-09 DIAGNOSIS — Z95810 Presence of automatic (implantable) cardiac defibrillator: Secondary | ICD-10-CM | POA: Diagnosis not present

## 2023-02-23 DIAGNOSIS — Z95810 Presence of automatic (implantable) cardiac defibrillator: Secondary | ICD-10-CM | POA: Diagnosis not present

## 2023-02-23 DIAGNOSIS — Z01818 Encounter for other preprocedural examination: Secondary | ICD-10-CM | POA: Diagnosis not present

## 2023-03-08 ENCOUNTER — Ambulatory Visit
Admission: RE | Admit: 2023-03-08 | Discharge: 2023-03-08 | Disposition: A | Discharge status: Home / Self Care | Payer: Medicare HMO | Source: Ambulatory Visit | Attending: Cardiology | Admitting: Cardiology

## 2023-03-08 ENCOUNTER — Encounter: Payer: Self-pay | Admitting: Cardiology

## 2023-03-08 ENCOUNTER — Encounter
Admission: RE | Disposition: A | Discharge status: Home / Self Care | Payer: Self-pay | Source: Ambulatory Visit | Attending: Cardiology

## 2023-03-08 ENCOUNTER — Other Ambulatory Visit: Payer: Self-pay

## 2023-03-08 DIAGNOSIS — Z4502 Encounter for adjustment and management of automatic implantable cardiac defibrillator: Secondary | ICD-10-CM | POA: Diagnosis not present

## 2023-03-08 DIAGNOSIS — Z45010 Encounter for checking and testing of cardiac pacemaker pulse generator [battery]: Secondary | ICD-10-CM

## 2023-03-08 DIAGNOSIS — Z01818 Encounter for other preprocedural examination: Secondary | ICD-10-CM

## 2023-03-08 HISTORY — PX: PPM GENERATOR CHANGEOUT: EP1233

## 2023-03-08 SURGERY — PPM GENERATOR CHANGEOUT
Anesthesia: Moderate Sedation

## 2023-03-08 MED ORDER — LIDOCAINE HCL (PF) 1 % IJ SOLN
INTRAMUSCULAR | Status: DC | PRN
  Administered 2023-03-08: 30 mL via SUBCUTANEOUS

## 2023-03-08 MED ORDER — FENTANYL CITRATE (PF) 100 MCG/2ML IJ SOLN
INTRAMUSCULAR | Status: DC | PRN
  Administered 2023-03-08 (×2): 25 ug via INTRAVENOUS

## 2023-03-08 MED ORDER — ACETAMINOPHEN 325 MG PO TABS: 325.0000 mg | ORAL_TABLET | ORAL | Status: DC | PRN

## 2023-03-08 MED ORDER — SODIUM CHLORIDE 0.9 % IV SOLN
80.0000 mg | INTRAVENOUS | Status: AC
  Administered 2023-03-08: 80 mg
  Filled 2023-03-08: qty 2

## 2023-03-08 MED ORDER — FENTANYL CITRATE (PF) 100 MCG/2ML IJ SOLN
INTRAMUSCULAR | Status: AC
  Filled 2023-03-08: qty 2

## 2023-03-08 MED ORDER — MIDAZOLAM HCL 2 MG/2ML IJ SOLN
INTRAMUSCULAR | Status: AC
  Filled 2023-03-08: qty 2

## 2023-03-08 MED ORDER — SODIUM CHLORIDE 0.9 % IV SOLN: INTRAVENOUS | Status: DC

## 2023-03-08 MED ORDER — MIDAZOLAM HCL 2 MG/2ML IJ SOLN
INTRAMUSCULAR | Status: DC | PRN
  Administered 2023-03-08: .5 mg via INTRAVENOUS
  Administered 2023-03-08: 1 mg via INTRAVENOUS

## 2023-03-08 MED ORDER — CHLORHEXIDINE GLUCONATE CLOTH 2 % EX PADS
6.0000 | MEDICATED_PAD | Freq: Every day | CUTANEOUS | Status: DC
  Administered 2023-03-08: 6 via TOPICAL

## 2023-03-08 MED ORDER — CEFAZOLIN SODIUM-DEXTROSE 2-4 GM/100ML-% IV SOLN
INTRAVENOUS | Status: AC
  Filled 2023-03-08: qty 100

## 2023-03-08 MED ORDER — CEFAZOLIN SODIUM-DEXTROSE 2-4 GM/100ML-% IV SOLN
2.0000 g | INTRAVENOUS | Status: AC
  Administered 2023-03-08: 2 g via INTRAVENOUS

## 2023-03-08 MED ORDER — LIDOCAINE HCL 1 % IJ SOLN
INTRAMUSCULAR | Status: AC
  Filled 2023-03-08: qty 40

## 2023-03-08 MED ORDER — ONDANSETRON HCL 4 MG/2ML IJ SOLN: 4.0000 mg | Freq: Four times a day (QID) | INTRAMUSCULAR | Status: DC | PRN

## 2023-03-08 MED ORDER — CEPHALEXIN 250 MG PO CAPS: 500.0000 mg | ORAL_CAPSULE | Freq: Two times a day (BID) | ORAL | 0 refills | Status: DC

## 2023-03-08 SURGICAL SUPPLY — 15 items
DEVICE DSSCT PLSMBLD 3.0S LGHT (MISCELLANEOUS) IMPLANT
DRAPE INCISE 23X17 STRL (DRAPES) IMPLANT
DRAPE INCISE IOBAN 23X17 STRL (DRAPES) ×1 IMPLANT
ELECT REM PT RETURN 9FT ADLT (ELECTROSURGICAL) ×1
ELECTRODE REM PT RTRN 9FT ADLT (ELECTROSURGICAL) IMPLANT
ICD COBALT XT QUAD CRT DTPA2Q1 (ICD Generator) IMPLANT
KIT SYRINGE INJ CVI SPIKEX1 (MISCELLANEOUS) IMPLANT
KIT WRENCH (KITS) IMPLANT
PAD ELECT DEFIB RADIOL ZOLL (MISCELLANEOUS) IMPLANT
PLASMABLADE 3.0S W/LIGHT (MISCELLANEOUS) ×1
POUCH AIGIS-R ANTIBACT ICD (Mesh General) ×1 IMPLANT
POUCH AIGIS-R ANTIBACT ICD LRG (Mesh General) IMPLANT
SUT VIC AB 2-0 CT2 27 (SUTURE) IMPLANT
SUT VIC AB 4-0 PS2 18 (SUTURE) IMPLANT
TRAY PACEMAKER INSERTION (PACKS) ×1 IMPLANT

## 2023-03-08 NOTE — Discharge Instructions (Addendum)
Patient may shower 03/10/2023.  May remove outer bandage after shower on 03/10/2023.  Leave Steri-Strips on.Cardioverter Defibrillator Implantation, Care After The following information offers guidance on how to care for yourself after your procedure. Your health care provider may also give you more specific instructions. If you have problems or questions, contact your health care provider. What can I expect after the procedure? After the procedure, it is common to have: Some pain. Pain may last a few days. A slight bump over the skin where the device was placed. Sometimes, it is possible to feel the device under the skin. This is normal. During the months and years after your procedure, your health care provider will check the device, the wires (leads), and the battery every few months. The generator is monitored over time and will be replaced when the battery is depleted. Follow these instructions at home: Medicines Take over-the-counter and prescription medicines only as told by your health care provider. If you were prescribed an antibiotic medicine, take it as told by your health care provider. Do not stop taking the antibiotic even if you start to feel better. Bathing Do not take baths, swim, or use a hot tub for 7 days You may shower on 11/30 you may remove your dressing then but leave steri strips until your follow up visit on 12/8 Incision care  Follow instructions from your health care provider about how to take care of your incision. Make sure you: Wash your hands with soap and water for at least 20 seconds before and after you change your bandage (dressing). If soap and water are not available, use hand sanitizer. Remove your dressing in 2 days but leave steri strips intact   These skin closures may need to stay in place for 2 weeks or longer. If adhesive strip edges start to loosen and curl up, you may trim the loose edges. Do not remove adhesive strips completely unless your health  care provider tells you to do that. Check your incision area every day for signs of infection. Check for: Redness, swelling, or more pain. Fluid or blood. Warmth. Pus or a bad smell. Do not use lotions or ointments near the incision area unless told by your health care provider. Do not wear tight clothes or clothes that could rub on your incision. Activity  Do not lift anything that is heavier than 10 lb (4.5 kg), or the limit that you are told, until your health care provider says that it is safe. Return to your normal activities Avoid sitting for a long time without moving.  Get up to take short walks every 1-2 hours. This is important to improve blood flow and breathing. Ask for help if you feel weak or unsteady. Do not drive or operate machinery for 24 hours Participate in a cardiac rehabilitation program as told by your health care provider. A cardiac rehabilitation program is a treatment program to improve your health and well-being through exercise training, education, and counseling. Electric and magnetic fields Tell all health care providers, including your dentist, that you have a defibrillator. Some medical devices and imaging methods such as MRI may affect your device. If you must pass through a metal detector, quickly walk through it. Do not stop under the detector. Do not stand near it. Avoid places or objects that have a strong electric or magnetic field, including: Engineer, maintenance. At the airport, let officials know that you have a defibrillator. Your defibrillator ID card will let you be checked in  a way that is safe for you and will not damage your defibrillator. Do not let a security person wave a magnetic wand near your defibrillator. That can make it stop working. Power plants. Large electrical generators. Anti-theft systems or electronic article surveillance (EAS). Radiofrequency transmission towers, such as mobile phone and radio towers. Some household devices  and appliances may produce electromagnetic waves that affect your defibrillator. If you are not sure if something is safe to use, ask your health care provider. When using your mobile phone, hold it to the ear that is on the opposite side from the defibrillator. Do not leave your mobile phone in a pocket over the defibrillator. Many devices contain magnets. Examples include headphones and electronic cigarettes. Do not put them in a pocket near your device. If they are wearable, wear them as far away from the device as possible. Some devices are safe to use if they are held at least 12 inches (30 cm) away from your defibrillator. These include power tools, chain saws, lawn mowers, and speakers. You may safely use electric blankets, heating pads, computers, and microwave ovens. Do not use amateur or ham radio equipment, electric (arc) welding torches, magnetic mattresses or chairs, and body fat measuring scales. General instructions Always keep your defibrillator ID card with you. The card should list the implant date, device model, and manufacturer. Consider wearing a medical alert bracelet or necklace. Have your defibrillator checked as often as told by your health care provider. Most defibrillators last for 5-7 years. Do not use any products that contain nicotine or tobacco. These products include cigarettes, chewing tobacco, and vaping devices, such as e-cigarettes. If you need help quitting, ask your health care provider. Discuss specific device restrictions with your device manufacturer or health care provider. Get screened for depression and anxiety, and seek treatment if needed. Keep all follow-up visits. This is important. Contact a health care provider if: You feel one shock in your chest. You gain weight suddenly. You have a fever. You have swelling in your arm on the same side of your body as the device or have swelling in your legs or feet. It feels like your heart is fluttering or  skipping beats (heart palpitations). You have any of these signs of infection around your incision: Redness, swelling, or more pain. Fluid or blood. Warmth. Pus or a bad smell. You are feeling depressed, scared, anxious, or sad. Get help right away if: You have chest pain. You feel more than one shock. You have problems breathing or have shortness of breath. You have dizziness or fainting. Your incision starts to open up. These symptoms may represent a serious problem that is an emergency. Do not wait to see if the symptoms will go away. Get medical help right away. Call your local emergency services (911 in the [place] Do not drive yourself to the hospital. Summary Always keep your defibrillator ID card with you. The card should list the implant date, device model, and manufacturer. Consider wearing a medical alert bracelet or necklace. Follow the device distance restrictions for electric and magnetic fields. Keep all follow-up visits with your health care provider for monitoring of your defibrillator. Contact your health care provider if you have signs of an infection such as a fever or redness, swelling, or more pain around your incision. Get help right away if you have chest pain, receive more than one shock, or faint. This information is not intended to replace advice given to you by your health care provider.  Make sure you discuss any questions you have with your health care provider. Document Revised: 06/04/2020 Document Reviewed: 06/04/2020 Elsevier Patient Education  [place].

## 2023-03-09 ENCOUNTER — Encounter: Payer: Self-pay | Admitting: Cardiology

## 2023-03-22 DIAGNOSIS — I10 Essential (primary) hypertension: Secondary | ICD-10-CM | POA: Diagnosis not present

## 2023-04-05 DIAGNOSIS — I4901 Ventricular fibrillation: Secondary | ICD-10-CM | POA: Diagnosis not present

## 2023-04-05 DIAGNOSIS — I10 Essential (primary) hypertension: Secondary | ICD-10-CM | POA: Diagnosis not present

## 2023-04-05 DIAGNOSIS — I2510 Atherosclerotic heart disease of native coronary artery without angina pectoris: Secondary | ICD-10-CM | POA: Diagnosis not present

## 2023-04-05 DIAGNOSIS — I5022 Chronic systolic (congestive) heart failure: Secondary | ICD-10-CM | POA: Diagnosis not present

## 2023-04-05 DIAGNOSIS — I420 Dilated cardiomyopathy: Secondary | ICD-10-CM | POA: Diagnosis not present

## 2023-04-05 DIAGNOSIS — Z95810 Presence of automatic (implantable) cardiac defibrillator: Secondary | ICD-10-CM | POA: Diagnosis not present

## 2023-04-05 DIAGNOSIS — I429 Cardiomyopathy, unspecified: Secondary | ICD-10-CM | POA: Diagnosis not present

## 2023-04-05 DIAGNOSIS — I509 Heart failure, unspecified: Secondary | ICD-10-CM | POA: Diagnosis not present

## 2023-04-16 DIAGNOSIS — S199XXA Unspecified injury of neck, initial encounter: Secondary | ICD-10-CM | POA: Diagnosis not present

## 2023-04-16 DIAGNOSIS — I4901 Ventricular fibrillation: Secondary | ICD-10-CM | POA: Diagnosis not present

## 2023-04-16 DIAGNOSIS — M19011 Primary osteoarthritis, right shoulder: Secondary | ICD-10-CM | POA: Diagnosis not present

## 2023-04-16 DIAGNOSIS — S32411A Displaced fracture of anterior wall of right acetabulum, initial encounter for closed fracture: Secondary | ICD-10-CM | POA: Diagnosis not present

## 2023-04-16 DIAGNOSIS — I110 Hypertensive heart disease with heart failure: Secondary | ICD-10-CM | POA: Diagnosis not present

## 2023-04-16 DIAGNOSIS — R9431 Abnormal electrocardiogram [ECG] [EKG]: Secondary | ICD-10-CM | POA: Diagnosis not present

## 2023-04-16 DIAGNOSIS — S2231XA Fracture of one rib, right side, initial encounter for closed fracture: Secondary | ICD-10-CM | POA: Diagnosis not present

## 2023-04-16 DIAGNOSIS — G8921 Chronic pain due to trauma: Secondary | ICD-10-CM | POA: Diagnosis not present

## 2023-04-16 DIAGNOSIS — S32111A Minimally displaced Zone I fracture of sacrum, initial encounter for closed fracture: Secondary | ICD-10-CM | POA: Diagnosis not present

## 2023-04-16 DIAGNOSIS — M85811 Other specified disorders of bone density and structure, right shoulder: Secondary | ICD-10-CM | POA: Diagnosis not present

## 2023-04-16 DIAGNOSIS — S2221XA Fracture of manubrium, initial encounter for closed fracture: Secondary | ICD-10-CM | POA: Diagnosis not present

## 2023-04-16 DIAGNOSIS — Y33XXXA Other specified events, undetermined intent, initial encounter: Secondary | ICD-10-CM | POA: Diagnosis not present

## 2023-04-16 DIAGNOSIS — W19XXXA Unspecified fall, initial encounter: Secondary | ICD-10-CM | POA: Diagnosis not present

## 2023-04-16 DIAGNOSIS — J439 Emphysema, unspecified: Secondary | ICD-10-CM | POA: Diagnosis not present

## 2023-04-16 DIAGNOSIS — S0990XA Unspecified injury of head, initial encounter: Secondary | ICD-10-CM | POA: Diagnosis not present

## 2023-04-16 DIAGNOSIS — S299XXA Unspecified injury of thorax, initial encounter: Secondary | ICD-10-CM | POA: Diagnosis not present

## 2023-04-16 DIAGNOSIS — J811 Chronic pulmonary edema: Secondary | ICD-10-CM | POA: Diagnosis not present

## 2023-04-16 DIAGNOSIS — S3282XA Multiple fractures of pelvis without disruption of pelvic ring, initial encounter for closed fracture: Secondary | ICD-10-CM | POA: Diagnosis not present

## 2023-04-16 DIAGNOSIS — J9811 Atelectasis: Secondary | ICD-10-CM | POA: Diagnosis not present

## 2023-04-16 DIAGNOSIS — S32119A Unspecified Zone I fracture of sacrum, initial encounter for closed fracture: Secondary | ICD-10-CM | POA: Diagnosis not present

## 2023-04-16 DIAGNOSIS — I959 Hypotension, unspecified: Secondary | ICD-10-CM | POA: Diagnosis not present

## 2023-04-16 DIAGNOSIS — M25511 Pain in right shoulder: Secondary | ICD-10-CM | POA: Diagnosis not present

## 2023-04-16 DIAGNOSIS — S32401A Unspecified fracture of right acetabulum, initial encounter for closed fracture: Secondary | ICD-10-CM | POA: Diagnosis not present

## 2023-04-16 DIAGNOSIS — S32811A Multiple fractures of pelvis with unstable disruption of pelvic ring, initial encounter for closed fracture: Secondary | ICD-10-CM | POA: Diagnosis not present

## 2023-04-16 DIAGNOSIS — S0993XA Unspecified injury of face, initial encounter: Secondary | ICD-10-CM | POA: Diagnosis not present

## 2023-04-16 DIAGNOSIS — S32591A Other specified fracture of right pubis, initial encounter for closed fracture: Secondary | ICD-10-CM | POA: Diagnosis not present

## 2023-04-16 DIAGNOSIS — R1312 Dysphagia, oropharyngeal phase: Secondary | ICD-10-CM | POA: Diagnosis not present

## 2023-04-16 DIAGNOSIS — S3992XA Unspecified injury of lower back, initial encounter: Secondary | ICD-10-CM | POA: Diagnosis not present

## 2023-04-16 DIAGNOSIS — I420 Dilated cardiomyopathy: Secondary | ICD-10-CM | POA: Diagnosis not present

## 2023-04-16 DIAGNOSIS — S32491A Other specified fracture of right acetabulum, initial encounter for closed fracture: Secondary | ICD-10-CM | POA: Diagnosis not present

## 2023-04-16 DIAGNOSIS — I5022 Chronic systolic (congestive) heart failure: Secondary | ICD-10-CM | POA: Diagnosis not present

## 2023-04-16 DIAGNOSIS — W11XXXA Fall on and from ladder, initial encounter: Secondary | ICD-10-CM | POA: Diagnosis not present

## 2023-04-16 DIAGNOSIS — S2241XA Multiple fractures of ribs, right side, initial encounter for closed fracture: Secondary | ICD-10-CM | POA: Diagnosis not present

## 2023-04-16 DIAGNOSIS — S3210XA Unspecified fracture of sacrum, initial encounter for closed fracture: Secondary | ICD-10-CM | POA: Diagnosis not present

## 2023-04-16 DIAGNOSIS — R918 Other nonspecific abnormal finding of lung field: Secondary | ICD-10-CM | POA: Diagnosis not present

## 2023-04-21 DIAGNOSIS — S2243XD Multiple fractures of ribs, bilateral, subsequent encounter for fracture with routine healing: Secondary | ICD-10-CM | POA: Diagnosis not present

## 2023-04-21 DIAGNOSIS — S32110D Nondisplaced Zone I fracture of sacrum, subsequent encounter for fracture with routine healing: Secondary | ICD-10-CM | POA: Diagnosis not present

## 2023-04-21 DIAGNOSIS — I5020 Unspecified systolic (congestive) heart failure: Secondary | ICD-10-CM | POA: Diagnosis not present

## 2023-04-21 DIAGNOSIS — G8911 Acute pain due to trauma: Secondary | ICD-10-CM | POA: Diagnosis not present

## 2023-04-21 DIAGNOSIS — I110 Hypertensive heart disease with heart failure: Secondary | ICD-10-CM | POA: Diagnosis not present

## 2023-04-21 DIAGNOSIS — S32409D Unspecified fracture of unspecified acetabulum, subsequent encounter for fracture with routine healing: Secondary | ICD-10-CM | POA: Diagnosis not present

## 2023-04-21 DIAGNOSIS — S32810D Multiple fractures of pelvis with stable disruption of pelvic ring, subsequent encounter for fracture with routine healing: Secondary | ICD-10-CM | POA: Diagnosis not present

## 2023-04-21 DIAGNOSIS — I480 Paroxysmal atrial fibrillation: Secondary | ICD-10-CM | POA: Diagnosis not present

## 2023-04-21 DIAGNOSIS — S32810A Multiple fractures of pelvis with stable disruption of pelvic ring, initial encounter for closed fracture: Secondary | ICD-10-CM | POA: Diagnosis not present

## 2023-04-21 DIAGNOSIS — S2221XD Fracture of manubrium, subsequent encounter for fracture with routine healing: Secondary | ICD-10-CM | POA: Diagnosis not present

## 2023-04-25 DIAGNOSIS — S2221XD Fracture of manubrium, subsequent encounter for fracture with routine healing: Secondary | ICD-10-CM | POA: Diagnosis not present

## 2023-04-25 DIAGNOSIS — G8911 Acute pain due to trauma: Secondary | ICD-10-CM | POA: Diagnosis not present

## 2023-04-25 DIAGNOSIS — I5020 Unspecified systolic (congestive) heart failure: Secondary | ICD-10-CM | POA: Diagnosis not present

## 2023-04-25 DIAGNOSIS — I110 Hypertensive heart disease with heart failure: Secondary | ICD-10-CM | POA: Diagnosis not present

## 2023-04-25 DIAGNOSIS — S32409D Unspecified fracture of unspecified acetabulum, subsequent encounter for fracture with routine healing: Secondary | ICD-10-CM | POA: Diagnosis not present

## 2023-04-25 DIAGNOSIS — S2243XD Multiple fractures of ribs, bilateral, subsequent encounter for fracture with routine healing: Secondary | ICD-10-CM | POA: Diagnosis not present

## 2023-04-25 DIAGNOSIS — S32810D Multiple fractures of pelvis with stable disruption of pelvic ring, subsequent encounter for fracture with routine healing: Secondary | ICD-10-CM | POA: Diagnosis not present

## 2023-04-25 DIAGNOSIS — S32110D Nondisplaced Zone I fracture of sacrum, subsequent encounter for fracture with routine healing: Secondary | ICD-10-CM | POA: Diagnosis not present

## 2023-04-25 DIAGNOSIS — I480 Paroxysmal atrial fibrillation: Secondary | ICD-10-CM | POA: Diagnosis not present

## 2023-04-26 DIAGNOSIS — I5020 Unspecified systolic (congestive) heart failure: Secondary | ICD-10-CM | POA: Diagnosis not present

## 2023-04-26 DIAGNOSIS — S32409D Unspecified fracture of unspecified acetabulum, subsequent encounter for fracture with routine healing: Secondary | ICD-10-CM | POA: Diagnosis not present

## 2023-04-26 DIAGNOSIS — I110 Hypertensive heart disease with heart failure: Secondary | ICD-10-CM | POA: Diagnosis not present

## 2023-04-26 DIAGNOSIS — G8911 Acute pain due to trauma: Secondary | ICD-10-CM | POA: Diagnosis not present

## 2023-04-26 DIAGNOSIS — S2221XD Fracture of manubrium, subsequent encounter for fracture with routine healing: Secondary | ICD-10-CM | POA: Diagnosis not present

## 2023-04-26 DIAGNOSIS — S32110D Nondisplaced Zone I fracture of sacrum, subsequent encounter for fracture with routine healing: Secondary | ICD-10-CM | POA: Diagnosis not present

## 2023-04-26 DIAGNOSIS — S2243XD Multiple fractures of ribs, bilateral, subsequent encounter for fracture with routine healing: Secondary | ICD-10-CM | POA: Diagnosis not present

## 2023-04-26 DIAGNOSIS — I480 Paroxysmal atrial fibrillation: Secondary | ICD-10-CM | POA: Diagnosis not present

## 2023-04-26 DIAGNOSIS — S32810D Multiple fractures of pelvis with stable disruption of pelvic ring, subsequent encounter for fracture with routine healing: Secondary | ICD-10-CM | POA: Diagnosis not present

## 2023-04-29 DIAGNOSIS — S2243XD Multiple fractures of ribs, bilateral, subsequent encounter for fracture with routine healing: Secondary | ICD-10-CM | POA: Diagnosis not present

## 2023-04-29 DIAGNOSIS — I480 Paroxysmal atrial fibrillation: Secondary | ICD-10-CM | POA: Diagnosis not present

## 2023-04-29 DIAGNOSIS — S32810D Multiple fractures of pelvis with stable disruption of pelvic ring, subsequent encounter for fracture with routine healing: Secondary | ICD-10-CM | POA: Diagnosis not present

## 2023-04-29 DIAGNOSIS — S32110D Nondisplaced Zone I fracture of sacrum, subsequent encounter for fracture with routine healing: Secondary | ICD-10-CM | POA: Diagnosis not present

## 2023-04-29 DIAGNOSIS — I5020 Unspecified systolic (congestive) heart failure: Secondary | ICD-10-CM | POA: Diagnosis not present

## 2023-04-29 DIAGNOSIS — S32409D Unspecified fracture of unspecified acetabulum, subsequent encounter for fracture with routine healing: Secondary | ICD-10-CM | POA: Diagnosis not present

## 2023-04-29 DIAGNOSIS — S2221XD Fracture of manubrium, subsequent encounter for fracture with routine healing: Secondary | ICD-10-CM | POA: Diagnosis not present

## 2023-04-29 DIAGNOSIS — G8911 Acute pain due to trauma: Secondary | ICD-10-CM | POA: Diagnosis not present

## 2023-04-29 DIAGNOSIS — I110 Hypertensive heart disease with heart failure: Secondary | ICD-10-CM | POA: Diagnosis not present

## 2023-05-02 DIAGNOSIS — E7800 Pure hypercholesterolemia, unspecified: Secondary | ICD-10-CM | POA: Diagnosis not present

## 2023-05-02 DIAGNOSIS — I4901 Ventricular fibrillation: Secondary | ICD-10-CM | POA: Diagnosis not present

## 2023-05-02 DIAGNOSIS — I420 Dilated cardiomyopathy: Secondary | ICD-10-CM | POA: Diagnosis not present

## 2023-05-02 DIAGNOSIS — F3341 Major depressive disorder, recurrent, in partial remission: Secondary | ICD-10-CM | POA: Diagnosis not present

## 2023-05-02 DIAGNOSIS — D649 Anemia, unspecified: Secondary | ICD-10-CM | POA: Diagnosis not present

## 2023-05-02 DIAGNOSIS — N401 Enlarged prostate with lower urinary tract symptoms: Secondary | ICD-10-CM | POA: Diagnosis not present

## 2023-05-02 DIAGNOSIS — F419 Anxiety disorder, unspecified: Secondary | ICD-10-CM | POA: Diagnosis not present

## 2023-05-02 DIAGNOSIS — R7302 Impaired glucose tolerance (oral): Secondary | ICD-10-CM | POA: Diagnosis not present

## 2023-05-02 DIAGNOSIS — I10 Essential (primary) hypertension: Secondary | ICD-10-CM | POA: Diagnosis not present

## 2023-05-04 DIAGNOSIS — G8911 Acute pain due to trauma: Secondary | ICD-10-CM | POA: Diagnosis not present

## 2023-05-04 DIAGNOSIS — S32409D Unspecified fracture of unspecified acetabulum, subsequent encounter for fracture with routine healing: Secondary | ICD-10-CM | POA: Diagnosis not present

## 2023-05-04 DIAGNOSIS — S32110D Nondisplaced Zone I fracture of sacrum, subsequent encounter for fracture with routine healing: Secondary | ICD-10-CM | POA: Diagnosis not present

## 2023-05-04 DIAGNOSIS — I5020 Unspecified systolic (congestive) heart failure: Secondary | ICD-10-CM | POA: Diagnosis not present

## 2023-05-04 DIAGNOSIS — I480 Paroxysmal atrial fibrillation: Secondary | ICD-10-CM | POA: Diagnosis not present

## 2023-05-04 DIAGNOSIS — S2221XD Fracture of manubrium, subsequent encounter for fracture with routine healing: Secondary | ICD-10-CM | POA: Diagnosis not present

## 2023-05-04 DIAGNOSIS — S2243XD Multiple fractures of ribs, bilateral, subsequent encounter for fracture with routine healing: Secondary | ICD-10-CM | POA: Diagnosis not present

## 2023-05-04 DIAGNOSIS — S32810D Multiple fractures of pelvis with stable disruption of pelvic ring, subsequent encounter for fracture with routine healing: Secondary | ICD-10-CM | POA: Diagnosis not present

## 2023-05-04 DIAGNOSIS — I110 Hypertensive heart disease with heart failure: Secondary | ICD-10-CM | POA: Diagnosis not present

## 2023-05-06 DIAGNOSIS — S2221XD Fracture of manubrium, subsequent encounter for fracture with routine healing: Secondary | ICD-10-CM | POA: Diagnosis not present

## 2023-05-06 DIAGNOSIS — I5020 Unspecified systolic (congestive) heart failure: Secondary | ICD-10-CM | POA: Diagnosis not present

## 2023-05-06 DIAGNOSIS — S2243XD Multiple fractures of ribs, bilateral, subsequent encounter for fracture with routine healing: Secondary | ICD-10-CM | POA: Diagnosis not present

## 2023-05-06 DIAGNOSIS — S32110D Nondisplaced Zone I fracture of sacrum, subsequent encounter for fracture with routine healing: Secondary | ICD-10-CM | POA: Diagnosis not present

## 2023-05-06 DIAGNOSIS — I110 Hypertensive heart disease with heart failure: Secondary | ICD-10-CM | POA: Diagnosis not present

## 2023-05-06 DIAGNOSIS — S32810D Multiple fractures of pelvis with stable disruption of pelvic ring, subsequent encounter for fracture with routine healing: Secondary | ICD-10-CM | POA: Diagnosis not present

## 2023-05-06 DIAGNOSIS — S32409D Unspecified fracture of unspecified acetabulum, subsequent encounter for fracture with routine healing: Secondary | ICD-10-CM | POA: Diagnosis not present

## 2023-05-06 DIAGNOSIS — I480 Paroxysmal atrial fibrillation: Secondary | ICD-10-CM | POA: Diagnosis not present

## 2023-05-06 DIAGNOSIS — G8911 Acute pain due to trauma: Secondary | ICD-10-CM | POA: Diagnosis not present

## 2023-05-09 DIAGNOSIS — I110 Hypertensive heart disease with heart failure: Secondary | ICD-10-CM | POA: Diagnosis not present

## 2023-05-09 DIAGNOSIS — I480 Paroxysmal atrial fibrillation: Secondary | ICD-10-CM | POA: Diagnosis not present

## 2023-05-09 DIAGNOSIS — S32409D Unspecified fracture of unspecified acetabulum, subsequent encounter for fracture with routine healing: Secondary | ICD-10-CM | POA: Diagnosis not present

## 2023-05-09 DIAGNOSIS — S2243XD Multiple fractures of ribs, bilateral, subsequent encounter for fracture with routine healing: Secondary | ICD-10-CM | POA: Diagnosis not present

## 2023-05-09 DIAGNOSIS — S32110D Nondisplaced Zone I fracture of sacrum, subsequent encounter for fracture with routine healing: Secondary | ICD-10-CM | POA: Diagnosis not present

## 2023-05-09 DIAGNOSIS — S32810D Multiple fractures of pelvis with stable disruption of pelvic ring, subsequent encounter for fracture with routine healing: Secondary | ICD-10-CM | POA: Diagnosis not present

## 2023-05-09 DIAGNOSIS — I5020 Unspecified systolic (congestive) heart failure: Secondary | ICD-10-CM | POA: Diagnosis not present

## 2023-05-09 DIAGNOSIS — G8911 Acute pain due to trauma: Secondary | ICD-10-CM | POA: Diagnosis not present

## 2023-05-09 DIAGNOSIS — S2221XD Fracture of manubrium, subsequent encounter for fracture with routine healing: Secondary | ICD-10-CM | POA: Diagnosis not present

## 2023-05-12 DIAGNOSIS — S32409D Unspecified fracture of unspecified acetabulum, subsequent encounter for fracture with routine healing: Secondary | ICD-10-CM | POA: Diagnosis not present

## 2023-05-12 DIAGNOSIS — S32110D Nondisplaced Zone I fracture of sacrum, subsequent encounter for fracture with routine healing: Secondary | ICD-10-CM | POA: Diagnosis not present

## 2023-05-12 DIAGNOSIS — I5020 Unspecified systolic (congestive) heart failure: Secondary | ICD-10-CM | POA: Diagnosis not present

## 2023-05-12 DIAGNOSIS — I110 Hypertensive heart disease with heart failure: Secondary | ICD-10-CM | POA: Diagnosis not present

## 2023-05-12 DIAGNOSIS — S2221XD Fracture of manubrium, subsequent encounter for fracture with routine healing: Secondary | ICD-10-CM | POA: Diagnosis not present

## 2023-05-12 DIAGNOSIS — S2243XD Multiple fractures of ribs, bilateral, subsequent encounter for fracture with routine healing: Secondary | ICD-10-CM | POA: Diagnosis not present

## 2023-05-12 DIAGNOSIS — G8911 Acute pain due to trauma: Secondary | ICD-10-CM | POA: Diagnosis not present

## 2023-05-12 DIAGNOSIS — S32810D Multiple fractures of pelvis with stable disruption of pelvic ring, subsequent encounter for fracture with routine healing: Secondary | ICD-10-CM | POA: Diagnosis not present

## 2023-05-12 DIAGNOSIS — I480 Paroxysmal atrial fibrillation: Secondary | ICD-10-CM | POA: Diagnosis not present

## 2023-05-18 DIAGNOSIS — S32409D Unspecified fracture of unspecified acetabulum, subsequent encounter for fracture with routine healing: Secondary | ICD-10-CM | POA: Diagnosis not present

## 2023-05-18 DIAGNOSIS — I110 Hypertensive heart disease with heart failure: Secondary | ICD-10-CM | POA: Diagnosis not present

## 2023-05-18 DIAGNOSIS — I5020 Unspecified systolic (congestive) heart failure: Secondary | ICD-10-CM | POA: Diagnosis not present

## 2023-05-18 DIAGNOSIS — S2243XD Multiple fractures of ribs, bilateral, subsequent encounter for fracture with routine healing: Secondary | ICD-10-CM | POA: Diagnosis not present

## 2023-05-18 DIAGNOSIS — S32110D Nondisplaced Zone I fracture of sacrum, subsequent encounter for fracture with routine healing: Secondary | ICD-10-CM | POA: Diagnosis not present

## 2023-05-18 DIAGNOSIS — I480 Paroxysmal atrial fibrillation: Secondary | ICD-10-CM | POA: Diagnosis not present

## 2023-05-18 DIAGNOSIS — S32810D Multiple fractures of pelvis with stable disruption of pelvic ring, subsequent encounter for fracture with routine healing: Secondary | ICD-10-CM | POA: Diagnosis not present

## 2023-05-18 DIAGNOSIS — S2221XD Fracture of manubrium, subsequent encounter for fracture with routine healing: Secondary | ICD-10-CM | POA: Diagnosis not present

## 2023-05-18 DIAGNOSIS — G8911 Acute pain due to trauma: Secondary | ICD-10-CM | POA: Diagnosis not present

## 2023-05-23 DIAGNOSIS — I429 Cardiomyopathy, unspecified: Secondary | ICD-10-CM | POA: Diagnosis not present

## 2023-05-23 DIAGNOSIS — I509 Heart failure, unspecified: Secondary | ICD-10-CM | POA: Diagnosis not present

## 2023-05-25 DIAGNOSIS — R109 Unspecified abdominal pain: Secondary | ICD-10-CM | POA: Diagnosis not present

## 2023-05-25 DIAGNOSIS — I480 Paroxysmal atrial fibrillation: Secondary | ICD-10-CM | POA: Diagnosis not present

## 2023-05-25 DIAGNOSIS — S3282XA Multiple fractures of pelvis without disruption of pelvic ring, initial encounter for closed fracture: Secondary | ICD-10-CM | POA: Diagnosis not present

## 2023-05-25 DIAGNOSIS — J449 Chronic obstructive pulmonary disease, unspecified: Secondary | ICD-10-CM | POA: Diagnosis not present

## 2023-05-25 DIAGNOSIS — E7800 Pure hypercholesterolemia, unspecified: Secondary | ICD-10-CM | POA: Diagnosis not present

## 2023-05-25 DIAGNOSIS — R1031 Right lower quadrant pain: Secondary | ICD-10-CM | POA: Diagnosis not present

## 2023-05-25 DIAGNOSIS — I509 Heart failure, unspecified: Secondary | ICD-10-CM | POA: Diagnosis not present

## 2023-05-25 DIAGNOSIS — S2241XA Multiple fractures of ribs, right side, initial encounter for closed fracture: Secondary | ICD-10-CM | POA: Diagnosis not present

## 2023-05-25 DIAGNOSIS — I420 Dilated cardiomyopathy: Secondary | ICD-10-CM | POA: Diagnosis not present

## 2023-05-25 DIAGNOSIS — I10 Essential (primary) hypertension: Secondary | ICD-10-CM | POA: Diagnosis not present

## 2023-05-31 DIAGNOSIS — I4901 Ventricular fibrillation: Secondary | ICD-10-CM | POA: Diagnosis not present

## 2023-05-31 DIAGNOSIS — R0789 Other chest pain: Secondary | ICD-10-CM | POA: Diagnosis not present

## 2023-05-31 DIAGNOSIS — Z95810 Presence of automatic (implantable) cardiac defibrillator: Secondary | ICD-10-CM | POA: Diagnosis not present

## 2023-05-31 DIAGNOSIS — I429 Cardiomyopathy, unspecified: Secondary | ICD-10-CM | POA: Diagnosis not present

## 2023-05-31 DIAGNOSIS — I2089 Other forms of angina pectoris: Secondary | ICD-10-CM | POA: Diagnosis not present

## 2023-05-31 DIAGNOSIS — I420 Dilated cardiomyopathy: Secondary | ICD-10-CM | POA: Diagnosis not present

## 2023-05-31 DIAGNOSIS — I5022 Chronic systolic (congestive) heart failure: Secondary | ICD-10-CM | POA: Diagnosis not present

## 2023-05-31 DIAGNOSIS — I10 Essential (primary) hypertension: Secondary | ICD-10-CM | POA: Diagnosis not present

## 2023-05-31 DIAGNOSIS — I4891 Unspecified atrial fibrillation: Secondary | ICD-10-CM | POA: Diagnosis not present

## 2023-06-01 ENCOUNTER — Other Ambulatory Visit: Payer: Self-pay | Admitting: Internal Medicine

## 2023-06-01 DIAGNOSIS — I2089 Other forms of angina pectoris: Secondary | ICD-10-CM

## 2023-06-01 DIAGNOSIS — I10 Essential (primary) hypertension: Secondary | ICD-10-CM

## 2023-06-01 DIAGNOSIS — R0789 Other chest pain: Secondary | ICD-10-CM

## 2023-06-08 DIAGNOSIS — R748 Abnormal levels of other serum enzymes: Secondary | ICD-10-CM | POA: Diagnosis not present

## 2023-06-08 DIAGNOSIS — R7989 Other specified abnormal findings of blood chemistry: Secondary | ICD-10-CM | POA: Diagnosis not present

## 2023-06-10 DIAGNOSIS — I472 Ventricular tachycardia, unspecified: Secondary | ICD-10-CM | POA: Diagnosis not present

## 2023-06-10 DIAGNOSIS — Z95810 Presence of automatic (implantable) cardiac defibrillator: Secondary | ICD-10-CM | POA: Diagnosis not present

## 2023-06-29 DIAGNOSIS — R748 Abnormal levels of other serum enzymes: Secondary | ICD-10-CM | POA: Diagnosis not present

## 2023-06-29 DIAGNOSIS — R17 Unspecified jaundice: Secondary | ICD-10-CM | POA: Diagnosis not present

## 2023-07-01 ENCOUNTER — Telehealth (HOSPITAL_COMMUNITY): Payer: Self-pay | Admitting: *Deleted

## 2023-07-01 MED ORDER — METOPROLOL TARTRATE 100 MG PO TABS: ORAL_TABLET | ORAL | 0 refills | Status: DC

## 2023-07-01 NOTE — Telephone Encounter (Signed)
Reaching out to patient to offer assistance regarding upcoming cardiac imaging study; pt verbalizes understanding of appt date/time, parking situation and where to check in, pre-test NPO status and medications ordered, and verified current allergies; name and call back number provided for further questions should they arise  Larey Brick RN Navigator Cardiac Imaging [facility] [phone] office [phone] cell  Patient to hold his carvedilol and take 100mg  metoprolol two hours prior to his cardiac CT scan.

## 2023-07-04 ENCOUNTER — Other Ambulatory Visit: Payer: Self-pay | Admitting: Internal Medicine

## 2023-07-04 ENCOUNTER — Ambulatory Visit
Admission: RE | Admit: 2023-07-04 | Discharge: 2023-07-04 | Disposition: A | Discharge status: Home / Self Care | Payer: Medicare HMO | Source: Ambulatory Visit | Attending: Internal Medicine | Admitting: Internal Medicine

## 2023-07-04 ENCOUNTER — Ambulatory Visit
Admission: RE | Admit: 2023-07-04 | Discharge: 2023-07-04 | Disposition: A | Discharge status: Home / Self Care | Payer: Self-pay | Source: Ambulatory Visit | Attending: Internal Medicine | Admitting: Internal Medicine

## 2023-07-04 DIAGNOSIS — I2089 Other forms of angina pectoris: Secondary | ICD-10-CM

## 2023-07-04 DIAGNOSIS — I10 Essential (primary) hypertension: Secondary | ICD-10-CM

## 2023-07-04 DIAGNOSIS — R0789 Other chest pain: Secondary | ICD-10-CM

## 2023-07-04 LAB — POCT I-STAT CREATININE: Creatinine, Ser: 1.2 mg/dL (ref 0.61–1.24)

## 2023-07-04 MED ORDER — IOHEXOL 350 MG/ML SOLN
80.0000 mL | Freq: Once | INTRAVENOUS | Status: AC | PRN
  Administered 2023-07-04: 80 mL via INTRAVENOUS

## 2023-07-04 MED ORDER — NITROGLYCERIN 0.4 MG SL SUBL
0.8000 mg | SUBLINGUAL_TABLET | Freq: Once | SUBLINGUAL | Status: AC
  Administered 2023-07-04: 0.8 mg via SUBLINGUAL
  Filled 2023-07-04: qty 25

## 2023-07-04 NOTE — Progress Notes (Signed)

## 2023-07-05 DIAGNOSIS — I5022 Chronic systolic (congestive) heart failure: Secondary | ICD-10-CM | POA: Diagnosis not present

## 2023-07-05 DIAGNOSIS — Z95810 Presence of automatic (implantable) cardiac defibrillator: Secondary | ICD-10-CM | POA: Diagnosis not present

## 2023-07-05 DIAGNOSIS — I4891 Unspecified atrial fibrillation: Secondary | ICD-10-CM | POA: Diagnosis not present

## 2023-07-05 DIAGNOSIS — I420 Dilated cardiomyopathy: Secondary | ICD-10-CM | POA: Diagnosis not present

## 2023-07-05 DIAGNOSIS — I472 Ventricular tachycardia, unspecified: Secondary | ICD-10-CM | POA: Diagnosis not present

## 2023-07-05 DIAGNOSIS — I4901 Ventricular fibrillation: Secondary | ICD-10-CM | POA: Diagnosis not present

## 2023-07-05 DIAGNOSIS — I2089 Other forms of angina pectoris: Secondary | ICD-10-CM | POA: Diagnosis not present

## 2023-07-05 DIAGNOSIS — J449 Chronic obstructive pulmonary disease, unspecified: Secondary | ICD-10-CM | POA: Diagnosis not present

## 2023-07-05 DIAGNOSIS — I10 Essential (primary) hypertension: Secondary | ICD-10-CM | POA: Diagnosis not present

## 2023-07-19 DIAGNOSIS — I420 Dilated cardiomyopathy: Secondary | ICD-10-CM | POA: Diagnosis not present

## 2023-07-19 DIAGNOSIS — I5022 Chronic systolic (congestive) heart failure: Secondary | ICD-10-CM | POA: Diagnosis not present

## 2023-07-19 DIAGNOSIS — Z95810 Presence of automatic (implantable) cardiac defibrillator: Secondary | ICD-10-CM | POA: Diagnosis not present

## 2023-07-19 DIAGNOSIS — I4891 Unspecified atrial fibrillation: Secondary | ICD-10-CM | POA: Diagnosis not present

## 2023-07-19 DIAGNOSIS — I2510 Atherosclerotic heart disease of native coronary artery without angina pectoris: Secondary | ICD-10-CM | POA: Diagnosis not present

## 2023-07-19 DIAGNOSIS — I472 Ventricular tachycardia, unspecified: Secondary | ICD-10-CM | POA: Diagnosis not present

## 2023-07-19 DIAGNOSIS — I4901 Ventricular fibrillation: Secondary | ICD-10-CM | POA: Diagnosis not present

## 2023-07-19 DIAGNOSIS — I10 Essential (primary) hypertension: Secondary | ICD-10-CM | POA: Diagnosis not present

## 2023-08-29 ENCOUNTER — Ambulatory Visit: Payer: Medicare HMO | Admitting: Urology

## 2023-08-30 DIAGNOSIS — Z95810 Presence of automatic (implantable) cardiac defibrillator: Secondary | ICD-10-CM | POA: Diagnosis not present

## 2023-08-31 ENCOUNTER — Other Ambulatory Visit: Payer: Self-pay | Admitting: Urology

## 2023-08-31 DIAGNOSIS — N401 Enlarged prostate with lower urinary tract symptoms: Secondary | ICD-10-CM

## 2023-09-04 NOTE — Progress Notes (Unsigned)
9:14 AM   Willeen House December 13, 1946 [id number]  Referring provider: Marina Goodell, MD [address] [place],  [place] [address]  Urological history: 1. BPH with LU TS -PSA (07/2021) 0.18 -cysto 05/30/2019 NED -tamsulosin 0.4 mg daily and finasteride 5 mg daily  2. Nocturia -contributing factors of age, hypertension, heart disease and BPH   3. ED -contributing factors of age, hypertension, heart disease, BPH and former smoker -not currently sexually active   4. Urethral strictures -remote history of urethral dilations -cysto 2020 NED  5. BXO -Underwent circumcision in 2017  Chief Complaint  Patient presents with   Benign Prostatic Hypertrophy   HPI: [name] is a 77 y.o. male who presents today for a yearly visit.  Previous records reviewed.   Has an AICD.    I PSS 3/1  Patient denies any modifying or aggravating factors.  Patient denies any recent UTI's, gross hematuria, dysuria or suprapubic/flank pain.  Patient denies any fevers, chills, nausea or vomiting.      IPSS     Row Name 09/05/23 0800         International Prostate Symptom Score   How often have you had the sensation of not emptying your bladder? Less than 1 in 5     How often have you had to urinate less than every two hours? Not at All     How often have you found you stopped and started again several times when you urinated? Not at All     How often have you found it difficult to postpone urination? Not at All     How often have you had a weak urinary stream? Less than 1 in 5 times     How often have you had to strain to start urination? Not at All     How many times did you typically get up at night to urinate? 1 Time     Total IPSS Score 3       Quality of Life due to urinary symptoms   If you were to spend the rest of your life with your urinary condition just the way it is now how would you feel about that? Pleased                 Score:  1-7 Mild 8-19  Moderate 20-35 Severe    PMH: Past Medical History:  Diagnosis Date   Acute prostatitis    Balanitis    Benign enlargement of prostate    Cardiomyopathy, dilated (HCC)    Frequency    HLD (hyperlipidemia)    HTN (hypertension)    Hyperlipidemia    Incomplete bladder emptying    Nocturia    Paroxysmal ventricular fibrillation (HCC)    Phimosis    Single vessel coronary artery disease    Systolic CHF [facility])     Surgical History: Past Surgical History:  Procedure Laterality Date   defribrillator     Implant   PPM GENERATOR CHANGEOUT N/A 03/08/2023   Procedure: PPM GENERATOR CHANGEOUT;  Surgeon: Marcina Millard, MD;  Location: [facility] INVASIVE CV LAB;  Service: Cardiovascular;  Laterality: N/A;   REPLACEMENT TOTAL KNEE     SHOULDER ARTHROSCOPY WITH OPEN ROTATOR CUFF REPAIR Right 08/06/2015   Procedure: SHOULDER ARTHROSCOPY WITH MINI OPEN ROTATOR CUFF REPAIR, SUBACROMIAL DECOPRESSION, RELEASE LONGHEAD BICEP TENDON ;  Surgeon: Erin Sons, MD;  Location: [facility] ORS;  Service: Orthopedics;  Laterality: Right;    Home Medications:  Allergies as  of 09/05/2023   No Known Allergies      Medication List        Accurate as of September 05, 2023  9:14 AM. If you have any questions, ask your nurse or doctor.          STOP taking these medications    cephALEXin 250 MG capsule Commonly known as: Keflex       TAKE these medications    albuterol 108 (90 Base) MCG/ACT inhaler Commonly known as: VENTOLIN HFA Inhale into the lungs.   amiodarone 200 MG tablet Commonly known as: PACERONE Take 200 mg by mouth daily.   aspirin EC 81 MG tablet Take 81 mg by mouth daily. Reported on 01/06/2016   atorvastatin 40 MG tablet Commonly known as: LIPITOR Take 40 mg by mouth daily.   carvedilol 12.5 MG tablet Commonly known as: COREG   celecoxib 100 MG capsule Commonly known as: CELEBREX TAKE 1 CAPSULE (100 MG TOTAL) BY MOUTH EVERY OTHER DAY   enalapril 2.5 MG  tablet Commonly known as: VASOTEC Take 2.5 mg by mouth daily.   finasteride 5 MG tablet Commonly known as: PROSCAR TAKE 1 TABLET BY MOUTH EVERY DAY   fluticasone 50 MCG/ACT nasal spray Commonly known as: FLONASE Place into both nostrils daily.   furosemide 40 MG tablet Commonly known as: LASIX Take 40 mg by mouth daily.   metoprolol tartrate 100 MG tablet Commonly known as: LOPRESSOR Take tablet (100mg ) TWO hours prior to your cardiac CT scan. Do NOT take your Carvedilol.   potassium chloride SA 20 MEQ tablet Commonly known as: KLOR-CON M Reported on 05/04/2016   sertraline 100 MG tablet Commonly known as: ZOLOFT Take 150 mg by mouth daily.   spironolactone 12.5 mg Tabs tablet Commonly known as: ALDACTONE Take 12.5 mg by mouth daily.   tamsulosin 0.4 MG Caps capsule Commonly known as: FLOMAX Take 1 capsule (0.4 mg total) by mouth daily.        Allergies: No Known Allergies  Family History: Family History  Problem Relation Age of Onset   Kidney disease Mother    Bladder Cancer Mother    Prostate cancer Neg Hx    Kidney cancer Neg Hx     Social History:  reports that he quit smoking about 16 years ago. His smoking use included cigarettes. He has never used smokeless tobacco. He reports that he does not drink alcohol and does not use drugs.  ROS: For pertinent review of systems please refer to history of present illness  Physical Exam: BP 107/68   Pulse 74   Ht 5\' 4"  (1.626 m)   Wt 153 lb 8 oz (69.6 kg)   BMI 26.35 kg/m    Constitutional:  Well nourished. Alert and oriented, No acute distress. HEENT: [place] AT, moist mucus membranes.  Trachea midline Cardiovascular: No clubbing, cyanosis, or edema. Respiratory: Normal respiratory effort, no increased work of breathing. Neurologic: Grossly intact, no focal deficits, moving all 4 extremities. Psychiatric: Normal mood and affect.   Laboratory Data: Urinalysis w/Microscopic Order: [id number] Component Ref  Range & Units 3 mo ago  Color Colorless, Straw, Light Yellow, Yellow, Dark Yellow Yellow  Clarity Clear Clear  Specific Gravity 1.000 - 1.030 1.020  pH, Urine 5.0 - 8.0 5.5  Protein, Urinalysis Negative, Trace mg/dL 30 Abnormal   Glucose, Urinalysis Negative mg/dL Negative  Ketones, Urinalysis Negative mg/dL Negative  Blood, Urinalysis Negative Negative  Nitrite, Urinalysis Negative Negative  Leukocyte Esterase, Urinalysis Negative Negative  White Blood  Cells, Urinalysis None Seen, 0-3 /hpf 0-3  Red Blood Cells, Urinalysis None Seen, 0-3 /hpf None Seen  Bacteria, Urinalysis None Seen /hpf None Seen  Squamous Epithelial Cells, Urinalysis Rare, Few, None Seen /hpf None Seen  Resulting Agency [facility] - LAB   Specimen Collected: 05/25/23 11:07   Performed by: [facility] Last Resulted: 05/25/23 11:26  Received From: [facility]  Result Received: 05/27/23 16:03   Hepatic Function Panel (HFP) Order: [id number] Component Ref Range & Units 2 mo ago  Protein, Total 6.1 - 7.9 g/dL 6.4  Albumin 3.5 - 4.8 g/dL 4.2  Bilirubin, Total 0.3 - 1.2 mg/dL 0.5  Bilirubin, Conjugated 0.00 - 0.20 mg/dL 4.09  Alk Phos (alkaline Phosphatase) 34 - 104 U/L 195 High   AST 8 - 39 U/L 21  ALT 6 - 57 U/L 23  Resulting Agency [facility]   Specimen Collected: 06/29/23 08:57   Performed by: [facility] Last Resulted: 06/29/23 12:46  Received From: [facility]  Result Received: 06/30/23 14:16   Contains abnormal data CBC w/auto Differential (3 Part) Order: [id number] Component Ref Range & Units 2 mo ago  WBC (White Blood Cell Count) 4.1 - 10.2 10^3/uL 4.4  RBC (Red Blood Cell Count) 4.69 - 6.13 10^6/uL 3.62 Low   Hemoglobin 14.1 - 18.1 gm/dL 95.6 Low   Hematocrit 21.3 - 52.0 % 36.9 Low   MCV (Mean Corpuscular Volume) 80.0 - 100.0 fl 101.9 High   MCH (Mean Corpuscular  Hemoglobin) 27.0 - 31.2 pg 32.3 High   MCHC (Mean Corpuscular Hemoglobin Concentration) 32.0 - 36.0 gm/dL 08.6 Low   Platelet Count 150 - 450 10^3/uL 359  RDW-CV (Red Cell Distribution Width) 11.6 - 14.8 % 12.6  MPV (Mean Platelet Volume) 9.4 - 12.4 fl 9.5  Neutrophils 1.50 - 7.80 10^3/uL 3.50  Lymphocytes 1.00 - 3.60 10^3/uL 0.30 Low   Mixed Count 0.10 - 0.90 10^3/uL 0.60  Neutrophil % 32.0 - 70.0 % 79.7 High   Lymphocyte % 10.0 - 50.0 % 7.3 Low   Mixed % 3.0 - 14.4 % 13.0  Resulting Agency [facility] - [facility]   Specimen Collected: 06/08/23 08:06   Performed by: [facility] Last Resulted: 06/08/23 10:38  Received From: [facility]  Result Received: 06/15/23 21:53   Comprehensive Metabolic Panel (CMP) Order: [id number] Component Ref Range & Units 4 mo ago  Glucose 70 - 110 mg/dL 528  Sodium 413 - 244 mmol/L 142  Potassium 3.6 - 5.1 mmol/L 4.7  Chloride 97 - 109 mmol/L 107  Carbon Dioxide (CO2) 22.0 - 32.0 mmol/L 28.0  Urea Nitrogen (BUN) 7 - 25 mg/dL 20  Creatinine 0.7 - 1.3 mg/dL 0.9  Glomerular Filtration Rate (eGFR) >60 mL/min/1.73sq m 90  Comment: CKD-EPI (2021) does not include patient's race in the calculation of eGFR.  Monitoring changes of plasma creatinine and eGFR over time is useful for monitoring kidney function.  Interpretive Ranges for eGFR (CKD-EPI 2021):  eGFR:       >60 mL/min/1.73 sq. m - Normal eGFR:       30-59 mL/min/1.73 sq. m - Moderately Decreased eGFR:       15-29 mL/min/1.73 sq. m  - Severely Decreased eGFR:       < 15 mL/min/1.73 sq. m  - Kidney Failure   Note: These eGFR calculations do not apply in acute situations when eGFR is changing rapidly  or patients on dialysis.  Calcium 8.7 - 10.3 mg/dL 9.7  AST 8 - 39 U/L 22  ALT 6 - 57 U/L 36  Alk Phos (alkaline Phosphatase) 34 - 104 U/L 253 High   Albumin 3.5 - 4.8 g/dL 4.5  Bilirubin, Total 0.3 - 1.2 mg/dL 0.6  Protein, Total 6.1  - 7.9 g/dL 7.1  A/G Ratio 1.0 - 5.0 gm/dL 1.7  Resulting Agency [facility]   Specimen Collected: 05/02/23 11:12   Performed by: [facility] Last Resulted: 05/02/23 15:38  Received From: [facility]  Result Received: 05/08/23 21:58   Lipid Panel w/calc LDL Order: [id number] Component Ref Range & Units 4 mo ago  Cholesterol, Total 100 - 200 mg/dL 253  Triglyceride 35 - 199 mg/dL 97  HDL (High Density Lipoprotein) Cholesterol 29.0 - 71.0 mg/dL 66.4  LDL Calculated 0 - 130 mg/dL 49  VLDL Cholesterol mg/dL 19  Cholesterol/HDL Ratio 2.6  Resulting Agency [facility]   Specimen Collected: 05/02/23 11:12   Performed by: [facility] Last Resulted: 05/02/23 15:38  Received From: [facility]  Result Received: 05/08/23 21:58   Hemoglobin A1C Order: [id number] Component Ref Range & Units 4 mo ago  Hemoglobin A1C 4.2 - 5.6 % 5.3  Average Blood Glucose (Calc) mg/dL 563  Resulting Agency [facility]  Narrative Performed by [facility] Normal Range:    4.2 - 5.6% Increased Risk:  5.7 - 6.4% Diabetes:        >= 6.5% Glycemic Control for adults with diabetes:  <7%    Specimen Collected: 05/02/23 11:12   Performed by: [facility] Last Resulted: 05/02/23 15:51  Received From: [facility]  Result Received: 05/08/23 21:58  I have reviewed the labs.  Pertinent Imaging N/A  Assessment & Plan:    1. BPH with LUTS -Continue tamsulosin 0.4 mg and finasteride 5 mg daily  2. Nocturia -Not bothersome -Manages with restricting fluids in the evening   Return in about 1 year (around 09/04/2024) for I PSS .  Cloretta Ned  [facility] [address] [place], [place] [address] [phone]

## 2023-09-05 ENCOUNTER — Encounter: Payer: Self-pay | Admitting: Urology

## 2023-09-05 ENCOUNTER — Ambulatory Visit: Payer: Medicare HMO | Admitting: Urology

## 2023-09-05 VITALS — BP 107/68 | HR 74 | Ht 64.0 in | Wt 153.5 lb

## 2023-09-05 DIAGNOSIS — N401 Enlarged prostate with lower urinary tract symptoms: Secondary | ICD-10-CM

## 2023-09-05 DIAGNOSIS — R351 Nocturia: Secondary | ICD-10-CM | POA: Diagnosis not present

## 2023-09-05 MED ORDER — TAMSULOSIN HCL 0.4 MG PO CAPS
0.4000 mg | ORAL_CAPSULE | Freq: Every day | ORAL | 3 refills | Status: DC
Start: 2023-09-05 — End: 2024-08-07

## 2023-09-29 ENCOUNTER — Ambulatory Visit: Payer: Medicare HMO

## 2023-09-29 ENCOUNTER — Ambulatory Visit
Admission: EM | Admit: 2023-09-29 | Discharge: 2023-09-29 | Disposition: A | Discharge status: Home / Self Care | Payer: Medicare HMO | Attending: Family Medicine | Admitting: Family Medicine

## 2023-09-29 DIAGNOSIS — J1282 Pneumonia due to coronavirus disease 2019: Secondary | ICD-10-CM | POA: Diagnosis not present

## 2023-09-29 DIAGNOSIS — R051 Acute cough: Secondary | ICD-10-CM | POA: Insufficient documentation

## 2023-09-29 DIAGNOSIS — R0789 Other chest pain: Secondary | ICD-10-CM

## 2023-09-29 DIAGNOSIS — R059 Cough, unspecified: Secondary | ICD-10-CM | POA: Diagnosis not present

## 2023-09-29 DIAGNOSIS — U071 COVID-19: Secondary | ICD-10-CM | POA: Diagnosis not present

## 2023-09-29 DIAGNOSIS — M129 Arthropathy, unspecified: Secondary | ICD-10-CM | POA: Diagnosis not present

## 2023-09-29 LAB — RESP PANEL BY RT-PCR (RSV, FLU A&B, COVID)  RVPGX2
Influenza A by PCR: NEGATIVE
Influenza B by PCR: NEGATIVE
Resp Syncytial Virus by PCR: NEGATIVE
SARS Coronavirus 2 by RT PCR: POSITIVE — AB

## 2023-09-29 MED ORDER — PROMETHAZINE-DM 6.25-15 MG/5ML PO SYRP: 2.5000 mL | ORAL_SOLUTION | Freq: Every evening | ORAL | 0 refills | Status: DC | PRN

## 2023-09-29 MED ORDER — BENZONATATE 100 MG PO CAPS: 100.0000 mg | ORAL_CAPSULE | Freq: Three times a day (TID) | ORAL | 0 refills | Status: DC

## 2023-09-29 MED ORDER — AMOXICILLIN-POT CLAVULANATE 875-125 MG PO TABS: 1.0000 | ORAL_TABLET | Freq: Two times a day (BID) | ORAL | 0 refills | Status: DC

## 2023-09-29 MED ORDER — DOXYCYCLINE HYCLATE 100 MG PO CAPS: 100.0000 mg | ORAL_CAPSULE | Freq: Two times a day (BID) | ORAL | 0 refills | Status: DC

## 2023-09-29 NOTE — ED Triage Notes (Signed)
Patient states that he's having a productive cough x 3 days. He's taken day and nyquil. No relief.

## 2023-09-29 NOTE — ED Provider Notes (Signed)
[facility]    CSN: [id number] Arrival date & time: 09/29/23  1448      History   Chief Complaint Chief Complaint  Patient presents with   Cough   Nasal Congestion    HPI [name] is a 77 y.o. male.   HPI  History obtained from the patient. Denilson presents for active productive cough, nasal congestion, rhinorrhea for the past 3 days. Has chest discomfort with coughing. Tried DayQuil and NyQuil without relief.. No known sick contacts.  He had some diarrhea yesterday.  No fever, chills, vomiting, diarrhea, nausea, sore throat. Endorse body aches, headache and sinus pressure.  He is tired and doesn't have much energy.       Past Medical History:  Diagnosis Date   Acute prostatitis    Balanitis    Benign enlargement of prostate    Cardiomyopathy, dilated (HCC)    Frequency    HLD (hyperlipidemia)    HTN (hypertension)    Hyperlipidemia    Incomplete bladder emptying    Nocturia    Paroxysmal ventricular fibrillation (HCC)    Phimosis    Single vessel coronary artery disease    Systolic CHF [facility])     Patient Active Problem List   Diagnosis Date Noted   Primary osteoarthritis of left knee 02/17/2018   Left knee pain 10/26/2017   BXO (balanitis xerotica obliterans) 02/03/2016   Erectile dysfunction of organic origin 02/03/2016   History of repair of rotator cuff 09/04/2015   Complete rotator cuff rupture of left shoulder 07/15/2015   Nocturia 07/09/2015   BPH with obstruction/lower urinary tract symptoms 07/09/2015   Phimosis 07/09/2015   Pain in shoulder 05/22/2015   Benign prostatic hyperplasia with urinary obstruction 10/02/2014   Cardiomyopathy, dilated, nonischemic (HCC) 04/29/2014   Ventricular fibrillation (HCC) 04/29/2014   Single vessel coronary artery disease 04/29/2014   Automatic implantable cardioverter-defibrillator in situ 03/14/2014   HLD (hyperlipidemia) 03/14/2014   BP (high blood pressure) 03/14/2014   Heart failure,  systolic (HCC) 03/14/2014   Congestive heart failure with left ventricular systolic dysfunction (HCC) 03/14/2014    Past Surgical History:  Procedure Laterality Date   defribrillator     Implant   PPM GENERATOR CHANGEOUT N/A 03/08/2023   Procedure: PPM GENERATOR CHANGEOUT;  Surgeon: Marcina Millard, MD;  Location: [facility] INVASIVE CV LAB;  Service: Cardiovascular;  Laterality: N/A;   REPLACEMENT TOTAL KNEE     SHOULDER ARTHROSCOPY WITH OPEN ROTATOR CUFF REPAIR Right 08/06/2015   Procedure: SHOULDER ARTHROSCOPY WITH MINI OPEN ROTATOR CUFF REPAIR, SUBACROMIAL DECOPRESSION, RELEASE LONGHEAD BICEP TENDON ;  Surgeon: Erin Sons, MD;  Location: [facility] ORS;  Service: Orthopedics;  Laterality: Right;       Home Medications    Prior to Admission medications   Medication Sig Start Date End Date Taking? Authorizing Provider  albuterol (VENTOLIN HFA) 108 (90 Base) MCG/ACT inhaler Inhale into the lungs. 02/16/16  Yes [provider]  amiodarone (PACERONE) 200 MG tablet Take 200 mg by mouth daily.   Yes [provider]  aspirin EC 81 MG tablet Take 81 mg by mouth daily. Reported on 01/06/2016   Yes [provider]  atorvastatin (LIPITOR) 40 MG tablet Take 40 mg by mouth daily.   Yes [provider]  benzonatate (TESSALON) 100 MG capsule Take 1 capsule (100 mg total) by mouth every 8 (eight) hours. 09/29/23  Yes Holland Kotter, Seward Meth, DO  carvedilol (COREG) 12.5 MG tablet  07/28/20  Yes [provider]  celecoxib (CELEBREX)  100 MG capsule TAKE 1 CAPSULE (100 MG TOTAL) BY MOUTH EVERY OTHER DAY 05/31/18  Yes [provider]  enalapril (VASOTEC) 2.5 MG tablet Take 2.5 mg by mouth daily.   Yes [provider]  finasteride (PROSCAR) 5 MG tablet TAKE 1 TABLET BY MOUTH EVERY DAY 08/31/23  Yes McGowan, Carollee Herter A, PA-C  fluticasone (FLONASE) 50 MCG/ACT nasal spray Place into both nostrils daily.   Yes [provider]  furosemide (LASIX) 40 MG  tablet Take 40 mg by mouth daily.   Yes [provider]  metoprolol tartrate (LOPRESSOR) 100 MG tablet Take tablet (100mg ) TWO hours prior to your cardiac CT scan. Do NOT take your Carvedilol. 07/01/23  Yes Agbor-Etang, Arlys John, MD  potassium chloride SA (KLOR-CON) 20 MEQ tablet Reported on 05/04/2016 10/18/15  Yes [provider]  promethazine-dextromethorphan (PROMETHAZINE-DM) 6.25-15 MG/5ML syrup Take 2.5-5 mLs by mouth at bedtime as needed for cough. 09/29/23  Yes Kavon Valenza, DO  sertraline (ZOLOFT) 100 MG tablet Take 150 mg by mouth daily. 06/04/22  Yes [provider]  spironolactone (ALDACTONE) 12.5 mg TABS tablet Take 12.5 mg by mouth daily.   Yes [provider]  tamsulosin (FLOMAX) 0.4 MG CAPS capsule Take 1 capsule (0.4 mg total) by mouth daily. 09/05/23  Yes McGowan, Wellington Hampshire, PA-C    Family History Family History  Problem Relation Age of Onset   Kidney disease Mother    Bladder Cancer Mother    Prostate cancer Neg Hx    Kidney cancer Neg Hx     Social History Social History   Tobacco Use   Smoking status: Former    Current packs/day: 0.00    Types: Cigarettes    Quit date: 2008    Years since quitting: 16.7   Smokeless tobacco: Never  Vaping Use   Vaping status: Never Used  Substance Use Topics   Alcohol use: No    Alcohol/week: 0.0 standard drinks of alcohol   Drug use: No     Allergies   Patient has no known allergies.   Review of Systems Review of Systems: negative unless otherwise stated in HPI.      Physical Exam Triage Vital Signs ED Triage Vitals  Encounter Vitals Group     BP 09/29/23 1518 (!) 89/60     Systolic BP Percentile --      Diastolic BP Percentile --      Pulse Rate 09/29/23 1518 84     Resp 09/29/23 1518 19     Temp 09/29/23 1518 98.5 F (36.9 C)     Temp Source 09/29/23 1518 Oral     SpO2 09/29/23 1518 94 %     Weight --      Height --      Head Circumference --      Peak Flow --       Pain Score 09/29/23 1517 0     Pain Loc --      Pain Education --      Exclude from Growth Chart --    No data found.  Updated Vital Signs BP 127/74   Pulse 84   Temp 98.5 F (36.9 C) (Oral)   Resp 19   SpO2 94%   Visual Acuity Right Eye Distance:   Left Eye Distance:   Bilateral Distance:    Right Eye Near:   Left Eye Near:    Bilateral Near:     Physical Exam GEN:     alert, ill but non-toxic  appearing male in no distress    HENT:  mucus membranes moist, oropharyngeal without lesions or erythema, clear nasal discharge EYES:   pupils equal and reactive, no scleral injection or discharge RESP:  no increased work of breathing, coarse breathe sounds diffusely, mild expiratory wheezing  CVS:   regular rate and rhythm Skin:   warm and dry    UC Treatments / Results  Labs (all labs ordered are listed, but only abnormal results are displayed) Labs Reviewed  RESP PANEL BY RT-PCR (RSV, FLU A&B, COVID)  [id number] - Abnormal; Notable for the following components:      Result Value   SARS Coronavirus 2 by RT PCR POSITIVE (*)    All other components within normal limits    EKG   Radiology DG Chest 2 View  Result Date: 09/29/2023 CLINICAL DATA:  Cough and chest tightness. EXAM: CHEST - 2 VIEW COMPARISON:  03/21/2014 FINDINGS: Left-sided pacemaker in place.The cardiomediastinal contours are normal. The lungs are clear. Pulmonary vasculature is normal. No consolidation, pleural effusion, or pneumothorax. No acute osseous abnormalities are seen. Chronic right shoulder arthropathy. IMPRESSION: No active cardiopulmonary disease. Electronically Signed   By: Narda Rutherford M.D.   On: 09/29/2023 19:00    Procedures Procedures (including critical care time)  Medications Ordered in UC Medications - No data to display  Initial Impression / Assessment and Plan / UC Course  I have reviewed the triage vital signs and the nursing notes.  Pertinent labs & imaging results that were  available during my care of the patient were reviewed by me and considered in my medical decision making (see chart for details).       Pt is a 77 y.o. male who presents for 3 days of respiratory symptoms. Davin is afebrile here without recent antipyretics. Satting well on room air.  He was initially somewhat hypotensive at 89/60 but rebounded to 127/74 after sitting.  Notes that his cardiologist recently changed his blood pressure medications around.  As advised patient to speak with his cardiologist about maybe his blood pressure dropping while he walks around.    Overall, pt is ill but non-toxic appearing, well hydrated, without respiratory distress. Pulmonary exam is remarkable for  coarse breathe sounds diffusely, mild expiratory wheezing.  COVID, influenza and RSV testing obtained and was influenza and RSV negative however was COVID-positive. Chest xray personally reviewed by me with questionable left lower band pneumonia  without pleural effusion, cardiomegaly or pneumothorax. Patient aware the radiologist has not read his xray and is comfortable with the preliminary read by me. Will review radiologist read when available and call patient if a change in plan is warranted.  Pt agreeable to this plan prior to discharge.   Treat COVID related pneumonia in the left lower lobe with dual antibiotics as below.  He has an albuterol inhaler at home and does not need a refill.  Given Promethazine DM and Tessalon Perles for cough. Discussed symptomatic treatment for his other symptoms.  Typical duration of symptoms discussed.   Return and ED precautions given and voiced understanding. Discussed MDM, treatment plan and plan for follow-up with patient who agrees with plan.   Radiology impression reviewed an did not call pneumonia in the area of concern.  Pt called and updated with new reading. Advised to not start antibiotics.   Final Clinical Impressions(s) / UC Diagnoses   Final diagnoses:  COVID-19   Acute cough     Discharge Instructions      Your test  for COVID-19 was positive, meaning that you were infected with the novel coronavirus and could give the germ to others.  The recommendations suggest returning to normal activities when, for at least 24 hours, symptoms are improving overall, and if a fever was present, it has been gone without use of a fever-reducing medication.  You should wear a mask for the next 5 days to prevent the spread of disease. Please continue good preventive care measures, including:  frequent hand-washing, avoid touching your face, cover coughs/sneezes, stay out of crowds and keep a 6 foot distance from others.  Go to the nearest hospital emergency room if fever/cough/breathlessness are severe or illness seems like a threat to life.  If your were prescribed medication. Stop by the pharmacy to pick it up. You can take Tylenol and/or Ibuprofen as needed for fever reduction and pain relief.    For cough: Use the benzoate capsules 3 times a day as needed and the cough syrup at nighttime.   For sore throat: try warm salt water gargles, Mucinex sore throat cough drops or cepacol lozenges, throat spray, warm tea or water with lemon/honey, popsicles or ice, or OTC cold relief medicine for throat discomfort. You can also purchase chloraseptic spray at the pharmacy or dollar store.   For congestion: take a daily anti-histamine like Zyrtec, Claritin, and a oral decongestant, such as pseudoephedrine.  You can also use Flonase 1-2 sprays in each nostril daily. Afrin is also a good option, if you do not have high blood pressure.    It is important to stay hydrated: drink plenty of fluids (water, gatorade/powerade/pedialyte, juices, or teas) to keep your throat moisturized and help further relieve irritation/discomfort.    Return or go to the Emergency Department if symptoms worsen or do not improve in the next few days      ED Prescriptions     Medication Sig Dispense  Auth. Provider   amoxicillin-clavulanate (AUGMENTIN) 875-125 MG tablet  (Status: Discontinued) Take 1 tablet by mouth every 12 (twelve) hours. 10 tablet Alquan Morrish, DO   doxycycline (VIBRAMYCIN) 100 MG capsule  (Status: Discontinued) Take 1 capsule (100 mg total) by mouth 2 (two) times daily. 10 capsule Itsel Opfer, DO   benzonatate (TESSALON) 100 MG capsule Take 1 capsule (100 mg total) by mouth every 8 (eight) hours. 21 capsule Rowene Suto, DO   promethazine-dextromethorphan (PROMETHAZINE-DM) 6.25-15 MG/5ML syrup Take 2.5-5 mLs by mouth at bedtime as needed for cough. 118 mL Katha Cabal, DO      PDMP not reviewed this encounter.   Katha Cabal, DO 09/29/23 1916

## 2023-09-29 NOTE — Discharge Instructions (Addendum)
Your test for COVID-19 was positive, meaning that you were infected with the novel coronavirus and could give the germ to others.  The recommendations suggest returning to normal activities when, for at least 24 hours, symptoms are improving overall, and if a fever was present, it has been gone without use of a fever-reducing medication.  You should wear a mask for the next 5 days to prevent the spread of disease. Please continue good preventive care measures, including:  frequent hand-washing, avoid touching your face, cover coughs/sneezes, stay out of crowds and keep a 6 foot distance from others.  Go to the nearest hospital emergency room if fever/cough/breathlessness are severe or illness seems like a threat to life.  If your were prescribed medication. Stop by the pharmacy to pick it up. You can take Tylenol and/or Ibuprofen as needed for fever reduction and pain relief.    For cough: Use the benzoate capsules 3 times a day as needed and the cough syrup at nighttime.   For sore throat: try warm salt water gargles, Mucinex sore throat cough drops or cepacol lozenges, throat spray, warm tea or water with lemon/honey, popsicles or ice, or OTC cold relief medicine for throat discomfort. You can also purchase chloraseptic spray at the pharmacy or dollar store.   For congestion: take a daily anti-histamine like Zyrtec, Claritin, and a oral decongestant, such as pseudoephedrine.  You can also use Flonase 1-2 sprays in each nostril daily. Afrin is also a good option, if you do not have high blood pressure.    It is important to stay hydrated: drink plenty of fluids (water, gatorade/powerade/pedialyte, juices, or teas) to keep your throat moisturized and help further relieve irritation/discomfort.    Return or go to the Emergency Department if symptoms worsen or do not improve in the next few days

## 2023-11-11 DIAGNOSIS — F419 Anxiety disorder, unspecified: Secondary | ICD-10-CM | POA: Diagnosis not present

## 2023-11-11 DIAGNOSIS — Z1331 Encounter for screening for depression: Secondary | ICD-10-CM | POA: Diagnosis not present

## 2023-11-11 DIAGNOSIS — I10 Essential (primary) hypertension: Secondary | ICD-10-CM | POA: Diagnosis not present

## 2023-11-11 DIAGNOSIS — R7302 Impaired glucose tolerance (oral): Secondary | ICD-10-CM | POA: Diagnosis not present

## 2023-11-11 DIAGNOSIS — I4901 Ventricular fibrillation: Secondary | ICD-10-CM | POA: Diagnosis not present

## 2023-11-11 DIAGNOSIS — E7800 Pure hypercholesterolemia, unspecified: Secondary | ICD-10-CM | POA: Diagnosis not present

## 2023-11-11 DIAGNOSIS — I420 Dilated cardiomyopathy: Secondary | ICD-10-CM | POA: Diagnosis not present

## 2023-11-11 DIAGNOSIS — F3341 Major depressive disorder, recurrent, in partial remission: Secondary | ICD-10-CM | POA: Diagnosis not present

## 2023-11-11 DIAGNOSIS — D649 Anemia, unspecified: Secondary | ICD-10-CM | POA: Diagnosis not present

## 2023-11-11 DIAGNOSIS — Z0000 Encounter for general adult medical examination without abnormal findings: Secondary | ICD-10-CM | POA: Diagnosis not present

## 2023-11-23 DIAGNOSIS — I2510 Atherosclerotic heart disease of native coronary artery without angina pectoris: Secondary | ICD-10-CM | POA: Diagnosis not present

## 2023-12-28 DIAGNOSIS — I10 Essential (primary) hypertension: Secondary | ICD-10-CM | POA: Diagnosis not present

## 2023-12-28 DIAGNOSIS — I4901 Ventricular fibrillation: Secondary | ICD-10-CM | POA: Diagnosis not present

## 2023-12-28 DIAGNOSIS — I472 Ventricular tachycardia, unspecified: Secondary | ICD-10-CM | POA: Diagnosis not present

## 2023-12-28 DIAGNOSIS — I2510 Atherosclerotic heart disease of native coronary artery without angina pectoris: Secondary | ICD-10-CM | POA: Diagnosis not present

## 2023-12-28 DIAGNOSIS — I420 Dilated cardiomyopathy: Secondary | ICD-10-CM | POA: Diagnosis not present

## 2023-12-28 DIAGNOSIS — Z95810 Presence of automatic (implantable) cardiac defibrillator: Secondary | ICD-10-CM | POA: Diagnosis not present

## 2023-12-28 DIAGNOSIS — I2089 Other forms of angina pectoris: Secondary | ICD-10-CM | POA: Diagnosis not present

## 2023-12-28 DIAGNOSIS — I5022 Chronic systolic (congestive) heart failure: Secondary | ICD-10-CM | POA: Diagnosis not present

## 2023-12-28 DIAGNOSIS — I4891 Unspecified atrial fibrillation: Secondary | ICD-10-CM | POA: Diagnosis not present

## 2024-02-28 DIAGNOSIS — I472 Ventricular tachycardia, unspecified: Secondary | ICD-10-CM | POA: Diagnosis not present

## 2024-02-28 DIAGNOSIS — Z95810 Presence of automatic (implantable) cardiac defibrillator: Secondary | ICD-10-CM | POA: Diagnosis not present

## 2024-02-29 DIAGNOSIS — Z95810 Presence of automatic (implantable) cardiac defibrillator: Secondary | ICD-10-CM | POA: Diagnosis not present

## 2024-02-29 DIAGNOSIS — I472 Ventricular tachycardia, unspecified: Secondary | ICD-10-CM | POA: Diagnosis not present

## 2024-02-29 DIAGNOSIS — Z5181 Encounter for therapeutic drug level monitoring: Secondary | ICD-10-CM | POA: Diagnosis not present

## 2024-02-29 DIAGNOSIS — Z79899 Other long term (current) drug therapy: Secondary | ICD-10-CM | POA: Diagnosis not present

## 2024-02-29 DIAGNOSIS — I10 Essential (primary) hypertension: Secondary | ICD-10-CM | POA: Diagnosis not present

## 2024-02-29 DIAGNOSIS — I4901 Ventricular fibrillation: Secondary | ICD-10-CM | POA: Diagnosis not present

## 2024-02-29 DIAGNOSIS — I5022 Chronic systolic (congestive) heart failure: Secondary | ICD-10-CM | POA: Diagnosis not present

## 2024-02-29 DIAGNOSIS — I420 Dilated cardiomyopathy: Secondary | ICD-10-CM | POA: Diagnosis not present

## 2024-02-29 DIAGNOSIS — R0609 Other forms of dyspnea: Secondary | ICD-10-CM | POA: Diagnosis not present

## 2024-03-14 DIAGNOSIS — R0602 Shortness of breath: Secondary | ICD-10-CM | POA: Diagnosis not present

## 2024-03-14 DIAGNOSIS — R0789 Other chest pain: Secondary | ICD-10-CM | POA: Diagnosis not present

## 2024-03-22 ENCOUNTER — Encounter: Payer: Self-pay | Admitting: Nurse Practitioner

## 2024-03-26 ENCOUNTER — Other Ambulatory Visit: Payer: Self-pay | Admitting: Nurse Practitioner

## 2024-03-26 DIAGNOSIS — I4901 Ventricular fibrillation: Secondary | ICD-10-CM

## 2024-03-26 DIAGNOSIS — R079 Chest pain, unspecified: Secondary | ICD-10-CM

## 2024-03-26 DIAGNOSIS — R9439 Abnormal result of other cardiovascular function study: Secondary | ICD-10-CM

## 2024-04-18 ENCOUNTER — Telehealth (HOSPITAL_COMMUNITY): Payer: Self-pay | Admitting: *Deleted

## 2024-04-18 ENCOUNTER — Encounter (HOSPITAL_COMMUNITY): Payer: Self-pay

## 2024-04-18 ENCOUNTER — Other Ambulatory Visit: Payer: Self-pay | Admitting: Cardiology

## 2024-04-18 DIAGNOSIS — R079 Chest pain, unspecified: Secondary | ICD-10-CM

## 2024-04-18 NOTE — Progress Notes (Signed)
 Orders only for PET CT stress.  Hamp Levine, PA-C

## 2024-04-18 NOTE — Telephone Encounter (Signed)
 Reaching out to patient to offer assistance regarding upcoming cardiac imaging study; pt verbalizes understanding of appt date/time, parking situation and where to check in, pre-test NPO status and medications ordered, and verified current allergies; name and call back number provided for further questions should they arise Kerri Peed RN Navigator Cardiac Imaging [facility] [phone] office [phone] cell  Patient aware to avoid caffeine for 12 hours prior and hold flomax .

## 2024-04-19 ENCOUNTER — Ambulatory Visit
Admission: RE | Admit: 2024-04-19 | Discharge: 2024-04-19 | Disposition: A | Discharge status: Home / Self Care | Source: Ambulatory Visit | Attending: Nurse Practitioner | Admitting: Nurse Practitioner

## 2024-04-19 DIAGNOSIS — R9439 Abnormal result of other cardiovascular function study: Secondary | ICD-10-CM | POA: Diagnosis not present

## 2024-04-19 DIAGNOSIS — K449 Diaphragmatic hernia without obstruction or gangrene: Secondary | ICD-10-CM | POA: Diagnosis not present

## 2024-04-19 DIAGNOSIS — R079 Chest pain, unspecified: Secondary | ICD-10-CM

## 2024-04-19 DIAGNOSIS — I700 Atherosclerosis of aorta: Secondary | ICD-10-CM | POA: Diagnosis not present

## 2024-04-19 DIAGNOSIS — J439 Emphysema, unspecified: Secondary | ICD-10-CM | POA: Diagnosis not present

## 2024-04-19 DIAGNOSIS — I4901 Ventricular fibrillation: Secondary | ICD-10-CM

## 2024-04-19 DIAGNOSIS — R918 Other nonspecific abnormal finding of lung field: Secondary | ICD-10-CM | POA: Insufficient documentation

## 2024-04-19 LAB — NM PET CT CARDIAC PERFUSION MULTI W/ABSOLUTE BLOODFLOW
LV dias vol: 131 mL (ref 62–150)
LV sys vol: 65 mL
MBFR: 1.98
Nuc Rest EF: 43 %
Nuc Stress EF: 50 %
Peak HR: 60 {beats}/min
Rest HR: 61 {beats}/min
Rest MBF: 0.63 ml/g/min
Rest Nuclear Isotope Dose: 18.8 mCi
SRS: 0
SSS: 0
ST Depression (mm): 0 mm
Stress MBF: 1.25 ml/g/min
Stress Nuclear Isotope Dose: 18.9 mCi
TID: 1.06

## 2024-04-19 MED ORDER — RUBIDIUM RB82 GENERATOR (RUBYFILL)
25.0000 | PACK | Freq: Once | INTRAVENOUS | Status: AC
  Administered 2024-04-19: 18.83 via INTRAVENOUS

## 2024-04-19 MED ORDER — REGADENOSON 0.4 MG/5ML IV SOLN
INTRAVENOUS | Status: AC
  Filled 2024-04-19: qty 5

## 2024-04-19 MED ORDER — RUBIDIUM RB82 GENERATOR (RUBYFILL)
25.0000 | PACK | Freq: Once | INTRAVENOUS | Status: AC
  Administered 2024-04-19: 18.88 via INTRAVENOUS

## 2024-04-19 MED ORDER — REGADENOSON 0.4 MG/5ML IV SOLN
0.4000 mg | Freq: Once | INTRAVENOUS | Status: AC
  Administered 2024-04-19: 0.4 mg via INTRAVENOUS
  Filled 2024-04-19: qty 5

## 2024-04-19 NOTE — Progress Notes (Signed)
 Patient presents for a cardiac PET stress test and tolerated procedure without incident. (PA at bedside during study). Patient maintained acceptable vital signs throughout the test and was offered caffeine after test.  Patient ambulated out of department with a steady gait.

## 2024-04-20 DIAGNOSIS — I4901 Ventricular fibrillation: Secondary | ICD-10-CM | POA: Diagnosis not present

## 2024-04-20 DIAGNOSIS — I5022 Chronic systolic (congestive) heart failure: Secondary | ICD-10-CM | POA: Diagnosis not present

## 2024-04-20 DIAGNOSIS — Z95810 Presence of automatic (implantable) cardiac defibrillator: Secondary | ICD-10-CM | POA: Diagnosis not present

## 2024-04-25 DIAGNOSIS — I5022 Chronic systolic (congestive) heart failure: Secondary | ICD-10-CM | POA: Diagnosis not present

## 2024-04-25 DIAGNOSIS — I4901 Ventricular fibrillation: Secondary | ICD-10-CM | POA: Diagnosis not present

## 2024-04-25 DIAGNOSIS — I10 Essential (primary) hypertension: Secondary | ICD-10-CM | POA: Diagnosis not present

## 2024-04-25 DIAGNOSIS — Z95810 Presence of automatic (implantable) cardiac defibrillator: Secondary | ICD-10-CM | POA: Diagnosis not present

## 2024-04-25 DIAGNOSIS — R0609 Other forms of dyspnea: Secondary | ICD-10-CM | POA: Diagnosis not present

## 2024-04-25 DIAGNOSIS — E7800 Pure hypercholesterolemia, unspecified: Secondary | ICD-10-CM | POA: Diagnosis not present

## 2024-04-25 DIAGNOSIS — Z5181 Encounter for therapeutic drug level monitoring: Secondary | ICD-10-CM | POA: Diagnosis not present

## 2024-04-25 DIAGNOSIS — I420 Dilated cardiomyopathy: Secondary | ICD-10-CM | POA: Diagnosis not present

## 2024-04-25 DIAGNOSIS — I2510 Atherosclerotic heart disease of native coronary artery without angina pectoris: Secondary | ICD-10-CM | POA: Diagnosis not present

## 2024-05-02 DIAGNOSIS — I2510 Atherosclerotic heart disease of native coronary artery without angina pectoris: Secondary | ICD-10-CM | POA: Diagnosis not present

## 2024-05-10 ENCOUNTER — Encounter: Payer: Self-pay | Admitting: Internal Medicine

## 2024-05-10 ENCOUNTER — Encounter
Admission: RE | Disposition: A | Discharge status: Home / Self Care | Payer: Self-pay | Source: Home / Self Care | Attending: Internal Medicine

## 2024-05-10 ENCOUNTER — Ambulatory Visit
Admission: RE | Admit: 2024-05-10 | Discharge: 2024-05-10 | Disposition: A | Discharge status: Home / Self Care | Attending: Internal Medicine | Admitting: Internal Medicine

## 2024-05-10 ENCOUNTER — Other Ambulatory Visit: Payer: Self-pay

## 2024-05-10 DIAGNOSIS — E7800 Pure hypercholesterolemia, unspecified: Secondary | ICD-10-CM | POA: Diagnosis not present

## 2024-05-10 DIAGNOSIS — I110 Hypertensive heart disease with heart failure: Secondary | ICD-10-CM | POA: Diagnosis not present

## 2024-05-10 DIAGNOSIS — Z79899 Other long term (current) drug therapy: Secondary | ICD-10-CM | POA: Diagnosis not present

## 2024-05-10 DIAGNOSIS — R9430 Abnormal result of cardiovascular function study, unspecified: Secondary | ICD-10-CM | POA: Insufficient documentation

## 2024-05-10 DIAGNOSIS — I420 Dilated cardiomyopathy: Secondary | ICD-10-CM | POA: Diagnosis not present

## 2024-05-10 DIAGNOSIS — Z95810 Presence of automatic (implantable) cardiac defibrillator: Secondary | ICD-10-CM | POA: Insufficient documentation

## 2024-05-10 DIAGNOSIS — Z87891 Personal history of nicotine dependence: Secondary | ICD-10-CM | POA: Diagnosis not present

## 2024-05-10 DIAGNOSIS — I5022 Chronic systolic (congestive) heart failure: Secondary | ICD-10-CM | POA: Insufficient documentation

## 2024-05-10 DIAGNOSIS — I480 Paroxysmal atrial fibrillation: Secondary | ICD-10-CM | POA: Diagnosis not present

## 2024-05-10 DIAGNOSIS — I2510 Atherosclerotic heart disease of native coronary artery without angina pectoris: Secondary | ICD-10-CM | POA: Insufficient documentation

## 2024-05-10 HISTORY — PX: LEFT HEART CATH AND CORONARY ANGIOGRAPHY: CATH118249

## 2024-05-10 LAB — CARDIAC CATHETERIZATION: Cath EF Quantitative: 50 %

## 2024-05-10 SURGERY — LEFT HEART CATH AND CORONARY ANGIOGRAPHY
Anesthesia: Moderate Sedation | Laterality: Left

## 2024-05-10 MED ORDER — IOHEXOL 300 MG/ML  SOLN
INTRAMUSCULAR | Status: DC | PRN
  Administered 2024-05-10: 50 mL

## 2024-05-10 MED ORDER — MIDAZOLAM HCL 2 MG/2ML IJ SOLN
INTRAMUSCULAR | Status: AC
  Filled 2024-05-10: qty 2

## 2024-05-10 MED ORDER — FENTANYL CITRATE (PF) 100 MCG/2ML IJ SOLN
INTRAMUSCULAR | Status: DC | PRN
  Administered 2024-05-10: 25 ug via INTRAVENOUS

## 2024-05-10 MED ORDER — SODIUM CHLORIDE 0.9 % IV SOLN: 250.0000 mL | INTRAVENOUS | Status: DC | PRN

## 2024-05-10 MED ORDER — VERAPAMIL HCL 2.5 MG/ML IV SOLN
INTRAVENOUS | Status: AC
  Filled 2024-05-10: qty 2

## 2024-05-10 MED ORDER — SODIUM CHLORIDE 0.9% FLUSH: 3.0000 mL | Freq: Two times a day (BID) | INTRAVENOUS | Status: DC

## 2024-05-10 MED ORDER — LIDOCAINE HCL (PF) 1 % IJ SOLN
INTRAMUSCULAR | Status: DC | PRN
  Administered 2024-05-10: 2 mL

## 2024-05-10 MED ORDER — VERAPAMIL HCL 2.5 MG/ML IV SOLN
INTRAVENOUS | Status: DC | PRN
  Administered 2024-05-10: 2.5 mg via INTRA_ARTERIAL

## 2024-05-10 MED ORDER — HEPARIN SODIUM (PORCINE) 1000 UNIT/ML IJ SOLN
INTRAMUSCULAR | Status: AC
Start: 2024-05-10 — End: ?
  Filled 2024-05-10: qty 10

## 2024-05-10 MED ORDER — SODIUM CHLORIDE 0.9 % IV SOLN: INTRAVENOUS | Status: DC

## 2024-05-10 MED ORDER — LIDOCAINE HCL 1 % IJ SOLN
INTRAMUSCULAR | Status: AC
  Filled 2024-05-10: qty 20

## 2024-05-10 MED ORDER — SODIUM CHLORIDE 0.9 % WEIGHT BASED INFUSION: 1.0000 mL/kg/h | INTRAVENOUS | Status: DC

## 2024-05-10 MED ORDER — FENTANYL CITRATE (PF) 100 MCG/2ML IJ SOLN
INTRAMUSCULAR | Status: AC
  Filled 2024-05-10: qty 2

## 2024-05-10 MED ORDER — HEPARIN (PORCINE) IN NACL 1000-0.9 UT/500ML-% IV SOLN
INTRAVENOUS | Status: DC | PRN
  Administered 2024-05-10: 1000 mL

## 2024-05-10 MED ORDER — SODIUM CHLORIDE 0.9% FLUSH: 3.0000 mL | INTRAVENOUS | Status: DC | PRN

## 2024-05-10 MED ORDER — MIDAZOLAM HCL 2 MG/2ML IJ SOLN
INTRAMUSCULAR | Status: DC | PRN
  Administered 2024-05-10: 1 mg via INTRAVENOUS

## 2024-05-10 MED ORDER — ASPIRIN 81 MG PO CHEW: 81.0000 mg | CHEWABLE_TABLET | ORAL | Status: DC

## 2024-05-10 MED ORDER — HEPARIN SODIUM (PORCINE) 1000 UNIT/ML IJ SOLN
INTRAMUSCULAR | Status: DC | PRN
  Administered 2024-05-10: 3000 [IU] via INTRAVENOUS

## 2024-05-10 MED ORDER — HEPARIN (PORCINE) IN NACL 1000-0.9 UT/500ML-% IV SOLN
INTRAVENOUS | Status: AC
  Filled 2024-05-10: qty 1000

## 2024-05-10 SURGICAL SUPPLY — 8 items
CATH 5FR JL3.5 JR4 ANG PIG MP (CATHETERS) IMPLANT
DEVICE RAD TR BAND REGULAR (VASCULAR PRODUCTS) IMPLANT
DRAPE BRACHIAL (DRAPES) IMPLANT
GLIDESHEATH SLEND SS 6F .021 (SHEATH) IMPLANT
GUIDEWIRE INQWIRE 1.5J.035X260 (WIRE) IMPLANT
PACK CARDIAC CATH (CUSTOM PROCEDURE TRAY) ×1 IMPLANT
SET ATX-X65L (MISCELLANEOUS) IMPLANT
STATION PROTECTION PRESSURIZED (MISCELLANEOUS) IMPLANT

## 2024-05-10 NOTE — Progress Notes (Signed)
 Dr. Beau Bound at bedside, speaking with pt. And his family x2. Pt. And family verbalized understanding of conversation with MD.

## 2024-05-11 ENCOUNTER — Encounter: Payer: Self-pay | Admitting: Internal Medicine

## 2024-05-16 DIAGNOSIS — I4901 Ventricular fibrillation: Secondary | ICD-10-CM | POA: Diagnosis not present

## 2024-05-16 DIAGNOSIS — R7302 Impaired glucose tolerance (oral): Secondary | ICD-10-CM | POA: Diagnosis not present

## 2024-05-16 DIAGNOSIS — F3341 Major depressive disorder, recurrent, in partial remission: Secondary | ICD-10-CM | POA: Diagnosis not present

## 2024-05-16 DIAGNOSIS — I10 Essential (primary) hypertension: Secondary | ICD-10-CM | POA: Diagnosis not present

## 2024-05-16 DIAGNOSIS — I420 Dilated cardiomyopathy: Secondary | ICD-10-CM | POA: Diagnosis not present

## 2024-05-16 DIAGNOSIS — N401 Enlarged prostate with lower urinary tract symptoms: Secondary | ICD-10-CM | POA: Diagnosis not present

## 2024-05-16 DIAGNOSIS — D649 Anemia, unspecified: Secondary | ICD-10-CM | POA: Diagnosis not present

## 2024-05-16 DIAGNOSIS — F419 Anxiety disorder, unspecified: Secondary | ICD-10-CM | POA: Diagnosis not present

## 2024-05-16 DIAGNOSIS — E7800 Pure hypercholesterolemia, unspecified: Secondary | ICD-10-CM | POA: Diagnosis not present

## 2024-05-22 DIAGNOSIS — Z95810 Presence of automatic (implantable) cardiac defibrillator: Secondary | ICD-10-CM | POA: Diagnosis not present

## 2024-05-22 DIAGNOSIS — I5022 Chronic systolic (congestive) heart failure: Secondary | ICD-10-CM | POA: Diagnosis not present

## 2024-05-22 DIAGNOSIS — I4901 Ventricular fibrillation: Secondary | ICD-10-CM | POA: Diagnosis not present

## 2024-05-22 DIAGNOSIS — I2510 Atherosclerotic heart disease of native coronary artery without angina pectoris: Secondary | ICD-10-CM | POA: Diagnosis not present

## 2024-05-23 DIAGNOSIS — I5023 Acute on chronic systolic (congestive) heart failure: Secondary | ICD-10-CM | POA: Diagnosis not present

## 2024-05-23 DIAGNOSIS — R0609 Other forms of dyspnea: Secondary | ICD-10-CM | POA: Diagnosis not present

## 2024-05-29 DIAGNOSIS — I420 Dilated cardiomyopathy: Secondary | ICD-10-CM | POA: Diagnosis not present

## 2024-06-12 ENCOUNTER — Ambulatory Visit (INDEPENDENT_AMBULATORY_CARE_PROVIDER_SITE_OTHER)

## 2024-06-12 ENCOUNTER — Ambulatory Visit
Admission: EM | Admit: 2024-06-12 | Discharge: 2024-06-12 | Disposition: A | Discharge status: Home / Self Care | Attending: Emergency Medicine | Admitting: Emergency Medicine

## 2024-06-12 ENCOUNTER — Ambulatory Visit: Payer: Self-pay | Admitting: Emergency Medicine

## 2024-06-12 DIAGNOSIS — Z20822 Contact with and (suspected) exposure to covid-19: Secondary | ICD-10-CM | POA: Insufficient documentation

## 2024-06-12 DIAGNOSIS — R059 Cough, unspecified: Secondary | ICD-10-CM | POA: Diagnosis not present

## 2024-06-12 DIAGNOSIS — R051 Acute cough: Secondary | ICD-10-CM

## 2024-06-12 DIAGNOSIS — J069 Acute upper respiratory infection, unspecified: Secondary | ICD-10-CM | POA: Insufficient documentation

## 2024-06-12 LAB — RESP PANEL BY RT-PCR (FLU A&B, COVID) ARPGX2
Influenza A by PCR: NEGATIVE
Influenza B by PCR: NEGATIVE
SARS Coronavirus 2 by RT PCR: NEGATIVE

## 2024-06-12 MED ORDER — FLUTICASONE PROPIONATE 50 MCG/ACT NA SUSP: 2.0000 | Freq: Every day | NASAL | 0 refills | Status: AC

## 2024-06-12 MED ORDER — PROMETHAZINE-DM 6.25-15 MG/5ML PO SYRP: 5.0000 mL | ORAL_SOLUTION | Freq: Four times a day (QID) | ORAL | 0 refills | Status: AC | PRN

## 2024-06-12 MED ORDER — BENZONATATE 200 MG PO CAPS
200.0000 mg | ORAL_CAPSULE | Freq: Three times a day (TID) | ORAL | 0 refills | Status: AC | PRN
Start: 2024-06-12 — End: ?

## 2024-06-12 NOTE — ED Triage Notes (Signed)
 Sx x 3 days  Chest congestion Emesis that happened yesterday.   CHF patient. Always has SOB. Fatigue.

## 2024-06-12 NOTE — ED Provider Notes (Signed)
 HPI  SUBJECTIVE:  [name] is a 78 y.o. male who presents with 3 days of body aches, nasal congestion, greenish rhinorrhea, chest congestion, cough productive of green sputum and slightly worsening of his baseline shortness of breath with exertion.  He denies change in his baseline shortness of breath.  He is waking up coughing.  He reports 1 episode of nausea with emesis yesterday, but has had no further episodes.  He has been tolerating p.o. since.  No fevers, headaches, sinus pain or pressure, sore throat, postnasal drip, wheezing, chest pain, abdominal pain.  No known COVID, flu exposure.  He got 5 doses of the COVID-vaccine and last year's flu vaccine.  His wife is currently sick with similar respiratory symptoms, but has not been medically evaluated.  He denies unintentional weight gain, nocturia, lower extremity edema, orthopnea, PND.  He has been taking Tylenol  with improvement in his symptoms, last dose was within the last 6 hours.  He has also tried NyQuil, DayQuil, Mucinex  cough and cold.  No aggravating factors. He has a past medical history of coronary artery disease, dilated cardiomyopathy, CHF, hyperlipidemia, hypertension, paroxysmal V-fib/V. tach status post defibrillator/pacemaker.  States that it has not fired.SABRA  He established care with pulmonology for dyspnea on exertion on 6/4 and is followed by cardiology for his dilated cardiomyopathy/CAD.  [facility]   Past Medical History:  Diagnosis Date   Acute prostatitis    Balanitis    Benign enlargement of prostate    Cardiomyopathy, dilated (HCC)    Frequency    HLD (hyperlipidemia)    HTN (hypertension)    Hyperlipidemia    Incomplete bladder emptying    Nocturia    Paroxysmal ventricular fibrillation (HCC)    Phimosis    Single vessel coronary artery disease    Systolic CHF [facility])     Past Surgical History:  Procedure Laterality Date   defribrillator     Implant   LEFT HEART CATH AND CORONARY  ANGIOGRAPHY Left 05/10/2024   Procedure: LEFT HEART CATH AND CORONARY ANGIOGRAPHY;  Surgeon: Florencio Cara BIRCH, MD;  Location: [facility] INVASIVE CV LAB;  Service: Cardiovascular;  Laterality: Left;   PPM GENERATOR CHANGEOUT N/A 03/08/2023   Procedure: PPM GENERATOR CHANGEOUT;  Surgeon: Ammon Blunt, MD;  Location: [facility] INVASIVE CV LAB;  Service: Cardiovascular;  Laterality: N/A;   REPLACEMENT TOTAL KNEE     SHOULDER ARTHROSCOPY WITH OPEN ROTATOR CUFF REPAIR Right 08/06/2015   Procedure: SHOULDER ARTHROSCOPY WITH MINI OPEN ROTATOR CUFF REPAIR, SUBACROMIAL DECOPRESSION, RELEASE LONGHEAD BICEP TENDON ;  Surgeon: Helayne [facility], MD;  Location: [facility] ORS;  Service: Orthopedics;  Laterality: Right;    Family History  Problem Relation Age of Onset   Kidney disease Mother    Bladder Cancer Mother    Prostate cancer Neg Hx    Kidney cancer Neg Hx     Social History   Tobacco Use   Smoking status: Former    Current packs/day: 0.00    Types: Cigarettes    Quit date: 2008    Years since quitting: 17.4   Smokeless tobacco: Never  Vaping Use   Vaping status: Never Used  Substance Use Topics   Alcohol use: No    Alcohol/week: 0.0 standard drinks of alcohol   Drug use: No    No current facility-administered medications for this encounter.  Current Outpatient Medications:    albuterol  (VENTOLIN  HFA) 108 (90 Base) MCG/ACT inhaler, Inhale 2 puffs into the lungs every 6 (six) hours as  needed for wheezing or shortness of breath., Disp: , Rfl:    amiodarone (PACERONE) 200 MG tablet, Take 200 mg by mouth 2 (two) times daily., Disp: , Rfl:    aspirin  EC 81 MG tablet, Take 81 mg by mouth daily. Reported on 01/06/2016, Disp: , Rfl:    atorvastatin (LIPITOR) 40 MG tablet, Take 40 mg by mouth daily., Disp: , Rfl:    benzonatate  (TESSALON ) 200 MG capsule, Take 1 capsule (200 mg total) by mouth 3 (three) times daily as needed for cough., Disp: 30 capsule, Rfl: 0   carvedilol (COREG) 25 MG tablet, Take  25 mg by mouth 2 (two) times daily with a meal., Disp: , Rfl:    enalapril (VASOTEC) 2.5 MG tablet, Take 2.5 mg by mouth daily., Disp: , Rfl:    ferrous sulfate 324 MG TBEC, Take 324 mg by mouth daily with breakfast., Disp: , Rfl:    finasteride (PROSCAR) 5 MG tablet, TAKE 1 TABLET BY MOUTH EVERY DAY, Disp: 90 tablet, Rfl: 3   fluticasone (FLONASE) 50 MCG/ACT nasal spray, Place 2 sprays into both nostrils daily., Disp: 16 g, Rfl: 0   furosemide (LASIX) 40 MG tablet, Take 40 mg by mouth daily., Disp: , Rfl:    mexiletine (MEXITIL) 150 MG capsule, Take 150 mg by mouth 3 (three) times daily., Disp: , Rfl:    promethazine -dextromethorphan (PROMETHAZINE -DM) 6.25-15 MG/5ML syrup, Take 5 mLs by mouth 4 (four) times daily as needed for cough., Disp: 118 mL, Rfl: 0   sertraline (ZOLOFT) 100 MG tablet, Take 150 mg by mouth daily., Disp: , Rfl:    spironolactone (ALDACTONE) 25 MG tablet, Take 12.5 mg by mouth daily., Disp: , Rfl:    tamsulosin  (FLOMAX ) 0.4 MG CAPS capsule, Take 1 capsule (0.4 mg total) by mouth daily., Disp: 90 capsule, Rfl: 3  No Known Allergies   ROS  As noted in HPI.   Physical Exam  BP 93/60 (BP Location: Left Arm)   Pulse 70   Temp 97.9 F (36.6 C) (Oral)   Resp (!) 22   SpO2 97%  BP Readings from Last 3 Encounters:  06/12/24 93/60  05/10/24 100/69  04/19/24 111/61   Repeat manual blood pressure taken by me 95/55  Constitutional: Well developed, well nourished, no acute distress Eyes: PERRL, EOMI, conjunctiva normal bilaterally HENT: Normocephalic, atraumatic,mucus membranes moist.  No nasal congestion.  Normal turbinates.  No maxillary, frontal sinus tenderness.  No postnasal drip. Neck: No cervical lymphadenopathy Respiratory: Clear to auscultation bilaterally, no rales, no wheezing, no rhonchi Cardiovascular: Normal rate and rhythm, no murmurs, no gallops, no rubs GI: nondistended skin: No rash, skin intact Musculoskeletal: Calves symmetric, nontender, no  edema. Neurologic: Alert & oriented x 3, CN III-XII grossly intact, no motor deficits, sensation grossly intact Psychiatric: Speech and behavior appropriate   ED Course   Medications - No data to display  Orders Placed This Encounter  Procedures   Resp Panel by RT-PCR (Flu A&B, Covid) Anterior Nasal Swab    Standing Status:   Standing    Number of Occurrences:   1   DG Chest 2 View    Standing Status:   Standing    Number of Occurrences:   1    Reason for Exam (SYMPTOM  OR DIAGNOSIS REQUIRED):   3 days cough, chest congestion.  History of CHF, smoking.  Rule out pneumonia, pulmonary edema, pleural effusion   Results for orders placed or performed during the hospital encounter of 06/12/24 (from the past  24 hours)  Resp Panel by RT-PCR (Flu A&B, Covid) Anterior Nasal Swab     Status: None   Collection Time: 06/12/24  1:09 PM   Specimen: Anterior Nasal Swab  Result Value Ref Range   SARS Coronavirus 2 by RT PCR NEGATIVE NEGATIVE   Influenza A by PCR NEGATIVE NEGATIVE   Influenza B by PCR NEGATIVE NEGATIVE   DG Chest 2 View Result Date: 06/12/2024 CLINICAL DATA:  Cough for 3 days EXAM: CHEST - 2 VIEW COMPARISON:  Chest x-ray 09/29/2023 and older FINDINGS: Left upper chest defibrillator. No consolidation, pneumothorax or effusion. No edema. Normal cardiopericardial silhouette. Degenerative changes along the spine. IMPRESSION: No acute cardiopulmonary disease.  Defibrillator. Electronically Signed   By: Ranell Bring M.D.   On: 06/12/2024 13:58    ED Clinical Impression  1. Acute upper respiratory infection   2. Acute cough   3. Lab test negative for COVID-19 virus      ED Assessment/Plan    Outside records, labs reviewed.  As noted in HPI.  creatinine clearance from labs done in May 2025 49 mL/min  Patient presents with what appears to be an upper respiratory infection.  His wife has similar symptoms.  Will check COVID/flu, and a chest x-ray.  Reviewed imaging  independently.  No changes compared to previous chest x-ray from October 2024 which was read negative for acute cardiopulmonary disease.  Formal radiology overread pending.  Will contact patient at [phone] if radiology overread differs enough from mine and we need to change management.    Reviewed radiology report.  No acute cardiopulmonary disease consistent with my read.  See radiology report for full details.  Will send home with Tylenol  1000 mg 3-4 times a day, saline nasal irrigation, Flonase, Tessalon  Promethazine  DM at night.  Continue Mucinex .  He does not qualify for antibiotics for sinusitis at this time.  Discussed this with patient.  He is amenable to this plan.  Blood pressure slightly lower than usual, but stable.  Advised him to keep an eye on his blood pressure.  COVID, influenza negative.  Chest x-ray no acute cardiopulmonary disease as read by radiology.  Discussed all results with patient while in department  Discussed labs, imaging, MDM, treatment plan, and plan for follow-up with patient Discussed sn/sx that should prompt return to the ED. patient agrees with plan.   Meds ordered this encounter  Medications   fluticasone (FLONASE) 50 MCG/ACT nasal spray    Sig: Place 2 sprays into both nostrils daily.    Dispense:  16 g    Refill:  0   benzonatate  (TESSALON ) 200 MG capsule    Sig: Take 1 capsule (200 mg total) by mouth 3 (three) times daily as needed for cough.    Dispense:  30 capsule    Refill:  0   promethazine -dextromethorphan (PROMETHAZINE -DM) 6.25-15 MG/5ML syrup    Sig: Take 5 mLs by mouth 4 (four) times daily as needed for cough.    Dispense:  118 mL    Refill:  0      *This clinic note was created using Scientist, clinical (histocompatibility and immunogenetics). Therefore, there may be occasional mistakes despite careful proofreading. ?    Van Knee, MD 06/12/24 1431

## 2024-06-12 NOTE — Discharge Instructions (Addendum)
 COVID, influenza PCR testing negative.  Chest x-ray is unchanged, no pneumonia per radiology read.  Continue Tylenol  1000 mg 3 or 4 times a day as needed for pain.  Saline nasal irrigation with a [employer] sinus rinse and distilled water as often as you want, Flonase.  Continue Mucinex .  Tessalon  for the cough, and if this does not work, then Promethazine  DM.  Keep a close eye on your blood pressure.  Go to the ER if the top number is consistently below 90.

## 2024-06-14 DIAGNOSIS — R051 Acute cough: Secondary | ICD-10-CM | POA: Diagnosis not present

## 2024-06-14 DIAGNOSIS — Z1331 Encounter for screening for depression: Secondary | ICD-10-CM | POA: Diagnosis not present

## 2024-06-14 DIAGNOSIS — R519 Headache, unspecified: Secondary | ICD-10-CM | POA: Diagnosis not present

## 2024-06-14 DIAGNOSIS — R0602 Shortness of breath: Secondary | ICD-10-CM | POA: Diagnosis not present

## 2024-06-27 DIAGNOSIS — I2510 Atherosclerotic heart disease of native coronary artery without angina pectoris: Secondary | ICD-10-CM | POA: Diagnosis not present

## 2024-06-27 DIAGNOSIS — I5022 Chronic systolic (congestive) heart failure: Secondary | ICD-10-CM | POA: Diagnosis not present

## 2024-06-27 DIAGNOSIS — I472 Ventricular tachycardia, unspecified: Secondary | ICD-10-CM | POA: Diagnosis not present

## 2024-06-27 DIAGNOSIS — Z95810 Presence of automatic (implantable) cardiac defibrillator: Secondary | ICD-10-CM | POA: Diagnosis not present

## 2024-07-23 ENCOUNTER — Encounter: Payer: Self-pay | Admitting: Urology

## 2024-07-30 DIAGNOSIS — I4901 Ventricular fibrillation: Secondary | ICD-10-CM | POA: Diagnosis not present

## 2024-07-30 DIAGNOSIS — Z95810 Presence of automatic (implantable) cardiac defibrillator: Secondary | ICD-10-CM | POA: Diagnosis not present

## 2024-07-30 DIAGNOSIS — I5022 Chronic systolic (congestive) heart failure: Secondary | ICD-10-CM | POA: Diagnosis not present

## 2024-08-06 DIAGNOSIS — I10 Essential (primary) hypertension: Secondary | ICD-10-CM | POA: Diagnosis not present

## 2024-08-06 DIAGNOSIS — F419 Anxiety disorder, unspecified: Secondary | ICD-10-CM | POA: Diagnosis not present

## 2024-08-06 DIAGNOSIS — E7800 Pure hypercholesterolemia, unspecified: Secondary | ICD-10-CM | POA: Diagnosis not present

## 2024-08-06 DIAGNOSIS — R7302 Impaired glucose tolerance (oral): Secondary | ICD-10-CM | POA: Diagnosis not present

## 2024-08-06 DIAGNOSIS — F3341 Major depressive disorder, recurrent, in partial remission: Secondary | ICD-10-CM | POA: Diagnosis not present

## 2024-08-07 ENCOUNTER — Other Ambulatory Visit: Payer: Self-pay | Admitting: Urology

## 2024-08-07 DIAGNOSIS — N401 Enlarged prostate with lower urinary tract symptoms: Secondary | ICD-10-CM

## 2024-08-07 DIAGNOSIS — N138 Other obstructive and reflux uropathy: Secondary | ICD-10-CM

## 2024-08-10 DIAGNOSIS — I4901 Ventricular fibrillation: Secondary | ICD-10-CM | POA: Diagnosis not present

## 2024-08-21 DIAGNOSIS — J449 Chronic obstructive pulmonary disease, unspecified: Secondary | ICD-10-CM | POA: Diagnosis not present

## 2024-08-21 DIAGNOSIS — I4891 Unspecified atrial fibrillation: Secondary | ICD-10-CM | POA: Diagnosis not present

## 2024-08-21 DIAGNOSIS — I5022 Chronic systolic (congestive) heart failure: Secondary | ICD-10-CM | POA: Diagnosis not present

## 2024-08-21 DIAGNOSIS — I4892 Unspecified atrial flutter: Secondary | ICD-10-CM | POA: Diagnosis not present

## 2024-08-21 DIAGNOSIS — R0609 Other forms of dyspnea: Secondary | ICD-10-CM | POA: Diagnosis not present

## 2024-08-28 DIAGNOSIS — I420 Dilated cardiomyopathy: Secondary | ICD-10-CM | POA: Diagnosis not present

## 2024-08-31 DIAGNOSIS — Z87891 Personal history of nicotine dependence: Secondary | ICD-10-CM | POA: Diagnosis not present

## 2024-08-31 DIAGNOSIS — J9811 Atelectasis: Secondary | ICD-10-CM | POA: Diagnosis not present

## 2024-08-31 DIAGNOSIS — J811 Chronic pulmonary edema: Secondary | ICD-10-CM | POA: Diagnosis not present

## 2024-08-31 DIAGNOSIS — Z95810 Presence of automatic (implantable) cardiac defibrillator: Secondary | ICD-10-CM | POA: Diagnosis not present

## 2024-08-31 DIAGNOSIS — Z950 Presence of cardiac pacemaker: Secondary | ICD-10-CM | POA: Diagnosis not present

## 2024-08-31 DIAGNOSIS — R918 Other nonspecific abnormal finding of lung field: Secondary | ICD-10-CM | POA: Diagnosis not present

## 2024-08-31 DIAGNOSIS — R42 Dizziness and giddiness: Secondary | ICD-10-CM | POA: Diagnosis not present

## 2024-08-31 DIAGNOSIS — I472 Ventricular tachycardia, unspecified: Secondary | ICD-10-CM | POA: Diagnosis not present

## 2024-08-31 DIAGNOSIS — R0789 Other chest pain: Secondary | ICD-10-CM | POA: Diagnosis not present

## 2024-08-31 DIAGNOSIS — I110 Hypertensive heart disease with heart failure: Secondary | ICD-10-CM | POA: Diagnosis not present

## 2024-09-01 DIAGNOSIS — R42 Dizziness and giddiness: Secondary | ICD-10-CM | POA: Diagnosis not present

## 2024-09-03 ENCOUNTER — Ambulatory Visit: Payer: Self-pay | Admitting: Urology

## 2024-09-09 NOTE — Progress Notes (Unsigned)
 8:13 PM   [name] 10/11/1946 [id number]  Referring provider: Jeffie Cheryl BRAVO, MD [address] [place],  [place] [address]  Urological history: 1. BPH with LU TS -PSA (07/2021) 0.18 -cysto 05/30/2019 NED -tamsulosin  0.4 mg daily and finasteride 5 mg daily  2. Nocturia -contributing factors of age, hypertension, heart disease and BPH   3. ED -contributing factors of age, hypertension, heart disease, BPH and former smoker -not currently sexually active   4. Urethral strictures -remote history of urethral dilations -cysto 2020 NED  5. BXO -Underwent circumcision in 2017  No chief complaint on file.  HPI: [name] is a 78 y.o. male who presents today for a yearly visit.  Previous records reviewed.    I PSS ***  He reports sensation of incomplete bladder emptying, urinary frequency, urinary intermittency, urinary urgency, a weak urinary stream, having to strain to void, nocturia x ***, leaking before being able to reach the restroom, leaking with coughing, leaking without awareness, and post void dribbling.     He is wearing *** pads//depends  daily.    Patient denies any modifying or aggravating factors.  Patient denies any recent UTI's, gross hematuria, dysuria or suprapubic/flank pain.  Patient denies any fevers, chills, nausea or vomiting.  ***  He has a family history of PCa, colon cancer, ovarian cancer and/or breast cancer with ***.   He does not have a family history of PCa, colon cancer, ovarian cancer, and/or breast cancer .***     UA***  PVR***  PSA  ***  Serum creatinine (08/2024) 1.3, eGFR 57  Hemoglobin A1c (04/2024) 5.8  Diuretics: Furosemide 40 mg  Fluid consumptiom: ***  He is taking tamsulosin  0.4 mg daily and finasteride 5 mg daily    PMH: Past Medical History:  Diagnosis Date   Acute prostatitis    Balanitis    Benign enlargement of prostate    Cardiomyopathy, dilated (HCC)    Frequency    HLD (hyperlipidemia)     HTN (hypertension)    Hyperlipidemia    Incomplete bladder emptying    Nocturia    Paroxysmal ventricular fibrillation (HCC)    Phimosis    Single vessel coronary artery disease    Systolic CHF [facility])     Surgical History: Past Surgical History:  Procedure Laterality Date   defribrillator     Implant   LEFT HEART CATH AND CORONARY ANGIOGRAPHY Left 05/10/2024   Procedure: LEFT HEART CATH AND CORONARY ANGIOGRAPHY;  Surgeon: Florencio Cara BIRCH, MD;  Location: [facility] INVASIVE CV LAB;  Service: Cardiovascular;  Laterality: Left;   PPM GENERATOR CHANGEOUT N/A 03/08/2023   Procedure: PPM GENERATOR CHANGEOUT;  Surgeon: Ammon Blunt, MD;  Location: [facility] INV[facility] CV LAB;  Service: Cardiovascular;  Laterality: N/A;   REPLACEMENT TOTAL KNEE     SHOULDER ARTHROSCOPY WITH OPEN ROTATOR CUFF REPAIR Right 08/06/2015   Procedure: SHOULDER ARTHROSCOPY WITH MINI OPEN ROTATOR CUFF REPAIR, SUBACROMIAL DECOPRESSION, RELEASE LONGHEAD BICEP TENDON ;  Surgeon: Helayne Glenn, MD;  Location: [facility] ORS;  Service: Orthopedics;  Laterality: Right;    Home Medications:  Allergies as of 09/10/2024   No Known Allergies      Medication List        Accurate as of September 09, 2024  8:13 PM. If you have any questions, ask your nurse or doctor.          albuterol  108 (90 Base) MCG/ACT inhaler Commonly known as: VENTOLIN  HFA Inhale 2 puffs into the lungs  every 6 (six) hours as needed for wheezing or shortness of breath.   amiodarone 200 MG tablet Commonly known as: PACERONE Take 200 mg by mouth 2 (two) times daily.   aspirin  EC 81 MG tablet Take 81 mg by mouth daily. Reported on 01/06/2016   atorvastatin 40 MG tablet Commonly known as: LIPITOR Take 40 mg by mouth daily.   benzonatate  200 MG capsule Commonly known as: TESSALON  Take 1 capsule (200 mg total) by mouth 3 (three) times daily as needed for cough.   carvedilol 25 MG tablet Commonly known as: COREG Take 25 mg by mouth 2 (two)  times daily with a meal.   enalapril 2.5 MG tablet Commonly known as: VASOTEC Take 2.5 mg by mouth daily.   ferrous sulfate 324 MG Tbec Take 324 mg by mouth daily with breakfast.   finasteride 5 MG tablet Commonly known as: PROSCAR TAKE 1 TABLET BY MOUTH EVERY DAY   fluticasone  50 MCG/ACT nasal spray Commonly known as: FLONASE  Place 2 sprays into both nostrils daily.   furosemide 40 MG tablet Commonly known as: LASIX Take 40 mg by mouth daily.   mexiletine 150 MG capsule Commonly known as: MEXITIL Take 150 mg by mouth 3 (three) times daily.   promethazine -dextromethorphan 6.25-15 MG/5ML syrup Commonly known as: PROMETHAZINE -DM Take 5 mLs by mouth 4 (four) times daily as needed for cough.   sertraline 100 MG tablet Commonly known as: ZOLOFT Take 150 mg by mouth daily.   spironolactone 25 MG tablet Commonly known as: ALDACTONE Take 12.5 mg by mouth daily.   tamsulosin  0.4 MG Caps capsule Commonly known as: FLOMAX  TAKE 1 CAPSULE BY MOUTH EVERY DAY        Allergies: No Known Allergies  Family History: Family History  Problem Relation Age of Onset   Kidney disease Mother    Bladder Cancer Mother    Prostate cancer Neg Hx    Kidney cancer Neg Hx     Social History:  reports that he quit smoking about 17 years ago. His smoking use included cigarettes. He has never used smokeless tobacco. He reports that he does not drink alcohol and does not use drugs.  ROS: For pertinent review of systems please refer to history of present illness  Physical Exam: There were no vitals taken for this visit.  Constitutional:  Well nourished. Alert and oriented, No acute distress. HEENT: [place] AT, moist mucus membranes.  Trachea midline, no masses. Cardiovascular: No clubbing, cyanosis, or edema. Respiratory: Normal respiratory effort, no increased work of breathing. GI: Abdomen is soft, non tender, non distended, no abdominal masses. Liver and spleen not palpable.  No hernias  appreciated.  Stool sample for occult testing is not indicated.   GU: No CVA tenderness.  No bladder fullness or masses.  Patient with circumcised/uncircumcised phallus. ***Foreskin easily retracted***  Urethral meatus is patent.  No penile discharge. No penile lesions or rashes. Scrotum without lesions, cysts, rashes and/or edema.  Testicles are located scrotally bilaterally. No masses are appreciated in the testicles. Left and right epididymis are normal. Rectal: Patient with  normal sphincter tone. Anus and perineum without scarring or rashes. No rectal masses are appreciated. Prostate is approximately *** grams, *** nodules are appreciated. Seminal vesicles are normal. Skin: No rashes, bruises or suspicious lesions. Lymph: No cervical or inguinal adenopathy. Neurologic: Grossly intact, no focal deficits, moving all 4 extremities. Psychiatric: Normal mood and affect. .   Laboratory Data See EPIC and HPI I have reviewed the labs.  Pertinent Imaging N/A  Assessment & Plan:    1. BPH with LUTS -Continue tamsulosin  0.4 mg and finasteride 5 mg daily - dizziness  2. Nocturia -Not bothersome -Manages with restricting fluids in the evening   No follow-ups on file.  CLOTILDA HELON RIGGERS  [facility] [address] [place], [place] [address] [phone]

## 2024-09-10 ENCOUNTER — Ambulatory Visit: Admitting: Urology

## 2024-09-10 ENCOUNTER — Encounter: Payer: Self-pay | Admitting: Urology

## 2024-09-10 VITALS — BP 106/62 | HR 81 | Wt 154.0 lb

## 2024-09-10 DIAGNOSIS — N401 Enlarged prostate with lower urinary tract symptoms: Secondary | ICD-10-CM | POA: Diagnosis not present

## 2024-09-10 DIAGNOSIS — R3912 Poor urinary stream: Secondary | ICD-10-CM

## 2024-10-02 DIAGNOSIS — Z8679 Personal history of other diseases of the circulatory system: Secondary | ICD-10-CM | POA: Diagnosis not present

## 2024-10-02 DIAGNOSIS — I428 Other cardiomyopathies: Secondary | ICD-10-CM | POA: Diagnosis not present

## 2024-10-02 DIAGNOSIS — Z87891 Personal history of nicotine dependence: Secondary | ICD-10-CM | POA: Diagnosis not present

## 2024-10-02 DIAGNOSIS — I472 Ventricular tachycardia, unspecified: Secondary | ICD-10-CM | POA: Diagnosis not present

## 2024-10-02 DIAGNOSIS — Z4502 Encounter for adjustment and management of automatic implantable cardiac defibrillator: Secondary | ICD-10-CM | POA: Diagnosis not present

## 2024-10-10 DIAGNOSIS — I5022 Chronic systolic (congestive) heart failure: Secondary | ICD-10-CM | POA: Diagnosis not present

## 2024-10-10 DIAGNOSIS — Z79899 Other long term (current) drug therapy: Secondary | ICD-10-CM | POA: Diagnosis not present

## 2024-10-10 DIAGNOSIS — I2510 Atherosclerotic heart disease of native coronary artery without angina pectoris: Secondary | ICD-10-CM | POA: Diagnosis not present

## 2024-10-10 DIAGNOSIS — I10 Essential (primary) hypertension: Secondary | ICD-10-CM | POA: Diagnosis not present

## 2024-10-10 DIAGNOSIS — J449 Chronic obstructive pulmonary disease, unspecified: Secondary | ICD-10-CM | POA: Diagnosis not present

## 2024-10-10 DIAGNOSIS — I472 Ventricular tachycardia, unspecified: Secondary | ICD-10-CM | POA: Diagnosis not present

## 2025-09-16 ENCOUNTER — Ambulatory Visit: Admitting: Urology
